# Patient Record
Sex: Female | Born: 1970 | Race: White | Hispanic: No | Marital: Married | Smoking: Never smoker
Health system: Southern US, Community
[De-identification: ages and names within clinical notes are randomized; demographics above are authoritative.]

## PROBLEM LIST (undated history)

## (undated) DIAGNOSIS — I10 Essential (primary) hypertension: Secondary | ICD-10-CM

## (undated) HISTORY — PX: ABDOMINAL HYSTERECTOMY: SHX81

## (undated) HISTORY — PX: TUBAL LIGATION: SHX77

## (undated) HISTORY — PX: CHOLECYSTECTOMY: SHX55

---

## 2000-05-05 ENCOUNTER — Other Ambulatory Visit: Admission: RE | Admit: 2000-05-05 | Discharge: 2000-05-05 | Payer: Self-pay | Admitting: Obstetrics and Gynecology

## 2003-01-24 ENCOUNTER — Encounter: Payer: Self-pay | Admitting: *Deleted

## 2003-01-24 ENCOUNTER — Inpatient Hospital Stay (HOSPITAL_COMMUNITY): Admission: AD | Admit: 2003-01-24 | Discharge: 2003-01-24 | Payer: Self-pay | Admitting: *Deleted

## 2003-01-28 ENCOUNTER — Inpatient Hospital Stay (HOSPITAL_COMMUNITY): Admission: AD | Admit: 2003-01-28 | Discharge: 2003-01-30 | Payer: Self-pay | Admitting: Obstetrics and Gynecology

## 2003-02-06 ENCOUNTER — Ambulatory Visit (HOSPITAL_COMMUNITY): Admission: RE | Admit: 2003-02-06 | Discharge: 2003-02-06 | Payer: Self-pay | Admitting: *Deleted

## 2015-04-01 ENCOUNTER — Emergency Department (HOSPITAL_COMMUNITY)
Admission: EM | Admit: 2015-04-01 | Discharge: 2015-04-01 | Disposition: A | Discharge status: Home / Self Care | Payer: BC Managed Care – PPO | Attending: Emergency Medicine | Admitting: Emergency Medicine

## 2015-04-01 ENCOUNTER — Encounter (HOSPITAL_COMMUNITY): Payer: Self-pay

## 2015-04-01 ENCOUNTER — Emergency Department (HOSPITAL_COMMUNITY): Payer: BC Managed Care – PPO

## 2015-04-01 DIAGNOSIS — I10 Essential (primary) hypertension: Secondary | ICD-10-CM | POA: Insufficient documentation

## 2015-04-01 DIAGNOSIS — J3489 Other specified disorders of nose and nasal sinuses: Secondary | ICD-10-CM | POA: Insufficient documentation

## 2015-04-01 DIAGNOSIS — R509 Fever, unspecified: Secondary | ICD-10-CM

## 2015-04-01 DIAGNOSIS — Z792 Long term (current) use of antibiotics: Secondary | ICD-10-CM | POA: Diagnosis not present

## 2015-04-01 DIAGNOSIS — R0981 Nasal congestion: Secondary | ICD-10-CM | POA: Diagnosis not present

## 2015-04-01 DIAGNOSIS — R300 Dysuria: Secondary | ICD-10-CM | POA: Diagnosis not present

## 2015-04-01 DIAGNOSIS — M546 Pain in thoracic spine: Secondary | ICD-10-CM | POA: Diagnosis not present

## 2015-04-01 DIAGNOSIS — R5383 Other fatigue: Secondary | ICD-10-CM | POA: Insufficient documentation

## 2015-04-01 DIAGNOSIS — Z79899 Other long term (current) drug therapy: Secondary | ICD-10-CM | POA: Diagnosis not present

## 2015-04-01 DIAGNOSIS — R0602 Shortness of breath: Secondary | ICD-10-CM | POA: Diagnosis not present

## 2015-04-01 DIAGNOSIS — R110 Nausea: Secondary | ICD-10-CM | POA: Diagnosis not present

## 2015-04-01 DIAGNOSIS — M25512 Pain in left shoulder: Secondary | ICD-10-CM | POA: Insufficient documentation

## 2015-04-01 DIAGNOSIS — M549 Dorsalgia, unspecified: Secondary | ICD-10-CM | POA: Diagnosis present

## 2015-04-01 HISTORY — DX: Essential (primary) hypertension: I10

## 2015-04-01 LAB — URINE MICROSCOPIC-ADD ON

## 2015-04-01 LAB — CBC
HEMATOCRIT: 37.9 % (ref 36.0–46.0)
HEMOGLOBIN: 12.8 g/dL (ref 12.0–15.0)
MCH: 31.4 pg (ref 26.0–34.0)
MCHC: 33.8 g/dL (ref 30.0–36.0)
MCV: 92.9 fL (ref 78.0–100.0)
Platelets: 167 10*3/uL (ref 150–400)
RBC: 4.08 MIL/uL (ref 3.87–5.11)
RDW: 13.5 % (ref 11.5–15.5)
WBC: 10.6 10*3/uL — ABNORMAL HIGH (ref 4.0–10.5)

## 2015-04-01 LAB — URINALYSIS, ROUTINE W REFLEX MICROSCOPIC
BILIRUBIN URINE: NEGATIVE
Glucose, UA: NEGATIVE mg/dL
Ketones, ur: NEGATIVE mg/dL
LEUKOCYTES UA: NEGATIVE
NITRITE: NEGATIVE
PH: 6 (ref 5.0–8.0)
Protein, ur: NEGATIVE mg/dL
SPECIFIC GRAVITY, URINE: 1.01 (ref 1.005–1.030)

## 2015-04-01 LAB — BASIC METABOLIC PANEL
ANION GAP: 8 (ref 5–15)
BUN: 11 mg/dL (ref 6–20)
CHLORIDE: 105 mmol/L (ref 101–111)
CO2: 23 mmol/L (ref 22–32)
Calcium: 8.5 mg/dL — ABNORMAL LOW (ref 8.9–10.3)
Creatinine, Ser: 0.92 mg/dL (ref 0.44–1.00)
GFR calc non Af Amer: >60 mL/min (ref >60)
GLUCOSE: 113 mg/dL — AB (ref 65–99)
POTASSIUM: 3.7 mmol/L (ref 3.5–5.1)
Sodium: 136 mmol/L (ref 135–145)

## 2015-04-01 LAB — I-STAT TROPONIN, ED: Troponin i, poc: 0 ng/mL (ref 0.00–0.08)

## 2015-04-01 LAB — LIPASE, BLOOD: Lipase: 21 U/L (ref 11–51)

## 2015-04-01 MED ORDER — IOHEXOL 350 MG/ML SOLN: 100.0000 mL | Freq: Once | INTRAVENOUS | Status: DC | PRN

## 2015-04-01 MED ORDER — OXYCODONE-ACETAMINOPHEN 5-325 MG PO TABS: 2.0000 | ORAL_TABLET | Freq: Four times a day (QID) | ORAL | Status: AC | PRN

## 2015-04-01 MED ORDER — ACETAMINOPHEN 500 MG PO TABS
1000.0000 mg | ORAL_TABLET | Freq: Once | ORAL | Status: AC
  Administered 2015-04-01: 1000 mg via ORAL
  Filled 2015-04-01: qty 2

## 2015-04-01 MED ORDER — MORPHINE SULFATE (PF) 4 MG/ML IV SOLN
4.0000 mg | Freq: Once | INTRAVENOUS | Status: AC
  Administered 2015-04-01: 4 mg via INTRAVENOUS
  Filled 2015-04-01: qty 1

## 2015-04-01 MED ORDER — SODIUM CHLORIDE 0.9 % IV BOLUS (SEPSIS)
1000.0000 mL | Freq: Once | INTRAVENOUS | Status: AC
  Administered 2015-04-01: 1000 mL via INTRAVENOUS

## 2015-04-01 NOTE — ED Notes (Signed)
Pt requesting pain medications. Dr.Mumma made aware

## 2015-04-01 NOTE — ED Notes (Signed)
Patient returned from XRay

## 2015-04-01 NOTE — Discharge Instructions (Signed)
Follow-up with your primary care physician in 1-2 days. Return to the emergency department for worsening shortness of breath, chest pain.

## 2015-04-01 NOTE — ED Notes (Signed)
Dr. Mumma at bedside.

## 2015-04-01 NOTE — ED Notes (Signed)
Per EMS, Patient has been having Hx of URI with antibiotic treatment. Patient reports feeling fatigued for the past couple days. Today, Patient started to have back pain that radiated down her right arm with increase upon cough, palpation, and deep breathing. Pt became SOB. Pt alert and oriented x4 upon arrival with Skin Warm, dry, and intact. Pt's pain 5/10. Patient was given 324 mg of Aspirin, 2 Nitro with no relief. Vitals 110/70, 100 HR, ST on EKG, 100% on RA, 2L for Comfort.

## 2015-04-01 NOTE — ED Notes (Signed)
CT contacted that pt's ready for angio

## 2015-04-01 NOTE — ED Provider Notes (Signed)
CSN: [id number]     Arrival date & time 04/01/15  1622 History   First MD Initiated Contact with Patient 04/01/15 1651     Chief Complaint  Patient presents with  . Back Pain     (Consider location/radiation/quality/duration/timing/severity/associated sxs/prior Treatment) HPI  Patient is a 44 year old female with past medical history significant for HTN, who presents to the emergency department with left back pain, shortness of breath, fever. Patient reports that she "hasn't felt well" over the past week. Reports a week history of congestion, rhinorrhea, dysuria, nausea. Patient seen at PCP 5 days ago, UA showed no signs of UTI at that time. Patient was started on clindamycin for sinusitis. States that her dysuria has resolved since that time. States that over the past 3 days she has had progressively worsening shortness of breath. Unable to walk across parking lot without feeling short of breath. Today he had the sudden onset of left scapula pain, sharp, constant, nonradiating. Nothing improves or worsens pain. Has not taken anything for the pain. Patient has felt feverish over the past 3 days, has not taken her temperature. No association with nausea, vomiting, diaphoresis. No history of PE/DVT. No recent surgery or prolonged immobility. No family history of blood clot.  Past Medical History  Diagnosis Date  . Hypertension    Past Surgical History  Procedure Laterality Date  . Tubal ligation    . Abdominal hysterectomy      Partial  . Cholecystectomy     History reviewed. No pertinent family history. Social History  Substance Use Topics  . Smoking status: Never Smoker   . Smokeless tobacco: Never Used  . Alcohol Use: Yes     Comment: Occasional   OB History    No data available     Review of Systems  Constitutional: Positive for fever, chills and fatigue. Negative for appetite change and unexpected weight change.  HENT: Positive for congestion and rhinorrhea. Negative for ear  pain and trouble swallowing.   Eyes: Negative for visual disturbance.  Respiratory: Positive for shortness of breath. Negative for cough, chest tightness and wheezing.   Cardiovascular: Negative for chest pain, palpitations and leg swelling.  Gastrointestinal: Negative for vomiting, abdominal pain and diarrhea.  Genitourinary: Positive for dysuria. Negative for urgency, frequency, hematuria, flank pain and vaginal discharge.  Musculoskeletal: Positive for back pain. Negative for neck pain and neck stiffness.  Skin: Negative for rash.  Neurological: Negative for dizziness, seizures, weakness and light-headedness.  Psychiatric/Behavioral: Negative for behavioral problems.      Allergies  Dilaudid and Wellbutrin  Home Medications   Prior to Admission medications   Medication Sig Start Date End Date Taking? Authorizing Provider  acetaminophen (TYLENOL) 325 MG tablet Take 650 mg by mouth every 6 (six) hours as needed for mild pain, moderate pain or headache.    Yes Historical Provider, MD  busPIRone (BUSPAR) 5 MG tablet Take 5 mg by mouth daily as needed (anxiety).  03/14/15  Yes Historical Provider, MD  cephALEXin (KEFLEX) 500 MG capsule Take 500 mg by mouth 3 (three) times daily. 03/24/15  Yes Historical Provider, MD  lisinopril-hydrochlorothiazide (PRINZIDE,ZESTORETIC) 20-12.5 MG tablet Take 1 tablet by mouth at bedtime. 07/20/13  Yes Historical Provider, MD  sertraline (ZOLOFT) 100 MG tablet Take 200 mg by mouth at bedtime. 03/02/15  Yes Historical Provider, MD  zolpidem (AMBIEN) 10 MG tablet Take 10 mg by mouth at bedtime. 03/29/15  Yes Historical Provider, MD  oxyCODONE-acetaminophen (PERCOCET/ROXICET) 5-325 MG tablet Take 2 tablets by  mouth every 6 (six) hours as needed for severe pain. 04/01/15   Corena HerterShannon Aydan Levitz, MD   BP 131/61 mmHg  Pulse 96  Temp(Src) 99.5 F (37.5 C) (Oral)  Resp 17  Ht 5\' 6"  (1.676 m)  Wt 131.543 kg  BMI 46.83 kg/m2  SpO2 97% Physical Exam  Constitutional:  She is oriented to person, place, and time. She appears well-developed and well-nourished. No distress.  HENT:  Head: Normocephalic and atraumatic.  Right Ear: External ear normal.  Left Ear: External ear normal.  Mouth/Throat: Oropharynx is clear and moist.  Eyes: Conjunctivae and EOM are normal. Pupils are equal, round, and reactive to light.  Neck: Normal range of motion. Neck supple. No JVD present. No tracheal deviation present.  Cardiovascular: Normal rate, regular rhythm, normal heart sounds and intact distal pulses.   No murmur heard. Pulmonary/Chest: Effort normal and breath sounds normal. No stridor. No respiratory distress. She has no wheezes. She has no rales. She exhibits no tenderness.  Abdominal: Soft. Bowel sounds are normal. She exhibits no distension. There is no tenderness. There is no rebound and no guarding.  Musculoskeletal: Normal range of motion. She exhibits no edema.  Neurological: She is alert and oriented to person, place, and time.  Skin: Skin is warm. No pallor.  Psychiatric: She has a normal mood and affect.  Nursing note and vitals reviewed.   ED Course  Procedures (including critical care time) Labs Review Labs Reviewed  BASIC METABOLIC PANEL - Abnormal; Notable for the following:    Glucose, Bld 113 (*)    Calcium 8.5 (*)    All other components within normal limits  CBC - Abnormal; Notable for the following:    WBC 10.6 (*)    All other components within normal limits  URINALYSIS, ROUTINE W REFLEX MICROSCOPIC (NOT AT [facility]) - Abnormal; Notable for the following:    Hgb urine dipstick TRACE (*)    All other components within normal limits  URINE MICROSCOPIC-ADD ON - Abnormal; Notable for the following:    Squamous Epithelial / LPF 0-5 (*)    Bacteria, UA FEW (*)    All other components within normal limits  LIPASE, BLOOD  I-STAT TROPOININ, ED    Imaging Review Dg Chest 2 View  04/01/2015  CLINICAL DATA:  Upper respiratory infection,  weakness, shortness of breath EXAM: CHEST  2 VIEW COMPARISON:  None. FINDINGS: The heart size and mediastinal contours are within normal limits. Both lungs are clear. The visualized skeletal structures are unremarkable. IMPRESSION: No active cardiopulmonary disease. Electronically Signed   By: Judie PetitM.  Shick M.D.   On: 04/01/2015 17:14   Ct Angio Chest Pe W/cm &/or Wo Cm  04/01/2015  CLINICAL DATA:  Upper respiratory infection.  Chest pain. EXAM: CT ANGIOGRAPHY CHEST WITH CONTRAST TECHNIQUE: Multidetector CT imaging of the chest was performed using the standard protocol during bolus administration of intravenous contrast. Multiplanar CT image reconstructions and MIPs were obtained to evaluate the vascular anatomy. CONTRAST:  100 cc Omnipaque 350 COMPARISON:  04/01/2015 FINDINGS: Body habitus reduces diagnostic sensitivity and specificity. Mediastinum/Nodes: No filling defect is identified in the pulmonary arterial tree to suggest pulmonary embolus. No acute aortic findings. No pathologic adenopathy in the chest. Lungs/Pleura: Unremarkable Upper abdomen: Unremarkable Musculoskeletal: Mild thoracic spondylosis. Review of the MIP images confirms the above findings. IMPRESSION: 1. No filling defect is identified in the pulmonary arterial tree to suggest pulmonary embolus. Sensitivity for subsegmental emboli reduced by body habitus. 2. No acute aortic findings or other  specific cause for the patient's current symptoms. Electronically Signed   By: Gaylyn Rong M.D.   On: 04/01/2015 21:10   I have personally reviewed and evaluated these images and lab results as part of my medical decision-making.   EKG Interpretation   Date/Time:  Tuesday April 01 2015 16:42:08 EST Ventricular Rate:  85 PR Interval:  163 QRS Duration: 89 QT Interval:  361 QTC Calculation: 429 R Axis:   65 Text Interpretation:  Sinus rhythm Low voltage, precordial leads Baseline  wander in lead(s) II III aVR aVL aVF V1 V2 V3 V4 V5 V6  Confirmed by BEATON   MD, ROBERT ([id number]) on 04/01/2015 7:12:57 PM      MDM   Final diagnoses:  Left-sided thoracic back pain  Shortness of breath  Fever, unspecified fever cause   Patient is a 44 year old female with a past medical history significant for hypertension, who presents to the emergency department with left back pain, shortness of breath, fever. One week history of not feeling well, started on clindamycin at PCP for sinusitis. 3 day history of progressively worsening shortness of breath, one day history of left scapula pain. On arrival no acute distress, appears ill. 100.5, hemodynamically stable, SPO2 98% on RA. Per report patient was tachycardic in the low 100s with EMS.  Differential diagnosis includes pneumonia, pneumothorax, pericarditis, pulmonary embolism, ACS, pleurisy, musculoskeletal. Heart score of 1, low risk. Low risk well's criteria.   EKG showed normal sinus rhythm. No interval abnormalities. No chamber enlargement. No signs of ischemia. No prior EKG to compare. CXR showed no acute findings. To be BC 10.6. No I abnormalities. UA showed no signs of infection. Initial troponin 0.00.  Patient given pain medication with minimal improvement of pain or shortness of breath. CTA PE study obtained, showed no signs of pulmonary embolism. Doubt pericarditis, no EKG changes, not consistent with clinical picture. Doubt ACS, low risk, no EKG findings, troponin negative. Patient most likely with pleurisy/musculoskeletal pain.   Patient discharged home with pain medication. Discussed strict follow-up with PCP in the next 1-2 days. Discussed strict return precautions to the emergency Department with any worsening shortness of breath or chest pain. Patient expressed understanding, no questions or concerns, discharge.   Corena Herter, MD 04/02/15 1610  Nelva Nay, MD 04/14/15 2245

## 2015-04-02 MED ORDER — IOHEXOL 350 MG/ML SOLN
100.0000 mL | Freq: Once | INTRAVENOUS | Status: AC | PRN
  Administered 2015-04-01: 100 mL via INTRAVENOUS

## 2015-09-02 DIAGNOSIS — I951 Orthostatic hypotension: Secondary | ICD-10-CM | POA: Insufficient documentation

## 2015-09-15 ENCOUNTER — Other Ambulatory Visit: Payer: Self-pay | Admitting: Family Medicine

## 2015-09-15 DIAGNOSIS — Z1231 Encounter for screening mammogram for malignant neoplasm of breast: Secondary | ICD-10-CM

## 2015-09-29 ENCOUNTER — Ambulatory Visit
Admission: RE | Admit: 2015-09-29 | Discharge: 2015-09-29 | Disposition: A | Discharge status: Home / Self Care | Payer: BC Managed Care – PPO | Source: Ambulatory Visit | Attending: Family Medicine | Admitting: Family Medicine

## 2015-09-29 DIAGNOSIS — Z1231 Encounter for screening mammogram for malignant neoplasm of breast: Secondary | ICD-10-CM

## 2016-11-17 IMAGING — CT CT ANGIO CHEST
2 of 6 series · 19 of 36 positions shown · IV contrast (Omni 300)
Comparison: 04/01/2015

CLINICAL DATA: Upper respiratory infection.  Chest pain.

EXAM:
CT ANGIOGRAPHY CHEST WITH CONTRAST
TECHNIQUE: Multidetector CT imaging of the chest was performed using the
standard protocol during bolus administration of intravenous
contrast. Multiplanar CT image reconstructions and MIPs were
obtained to evaluate the vascular anatomy.
CONTRAST:  100 cc Omnipaque 350

[Series 6: pe thins · axial · 0.73mm/px · z∈[-530,-274]mm · 18 of 570 slices shown]
[im 28/570  lung]
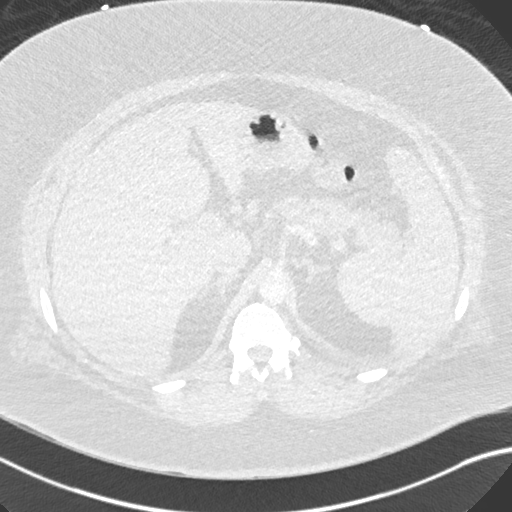
[im 55/570  mediastinal]
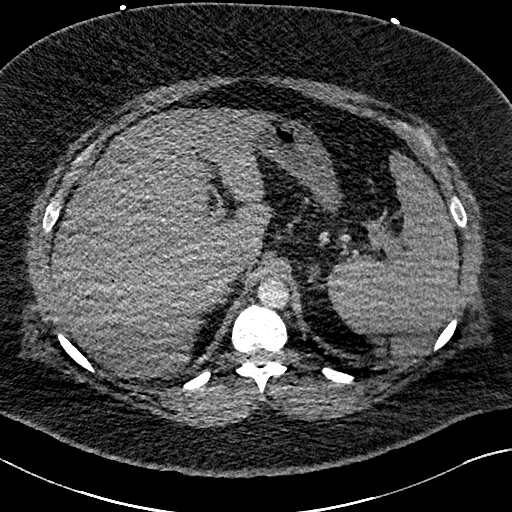
[im 82/570  lung]
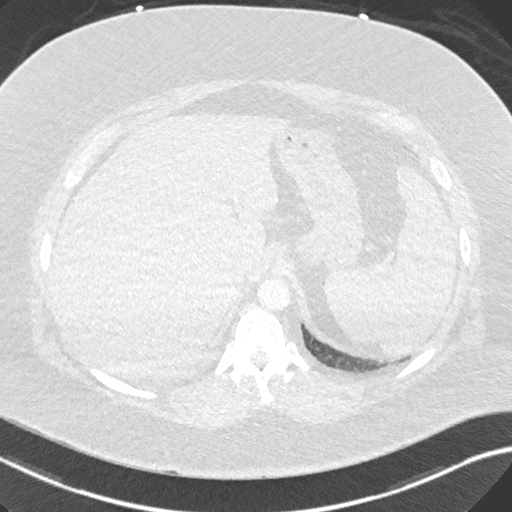
[im 109/570  mediastinal]
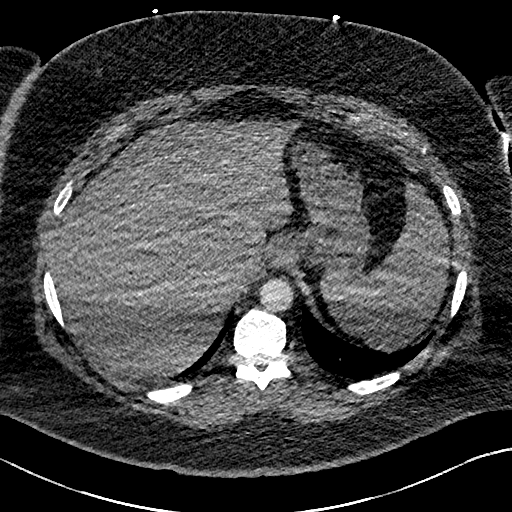
[im 163/570  lung]
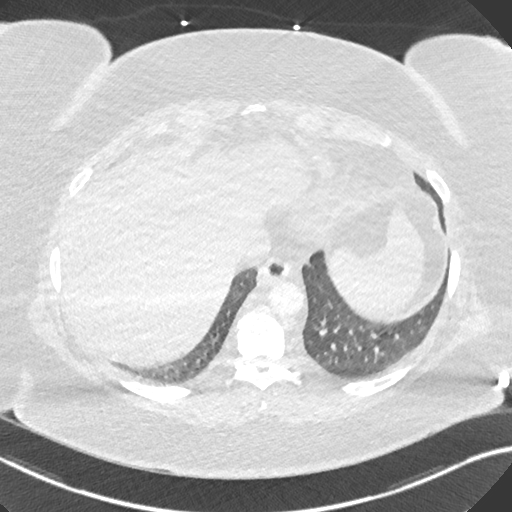
[im 190/570  mediastinal]
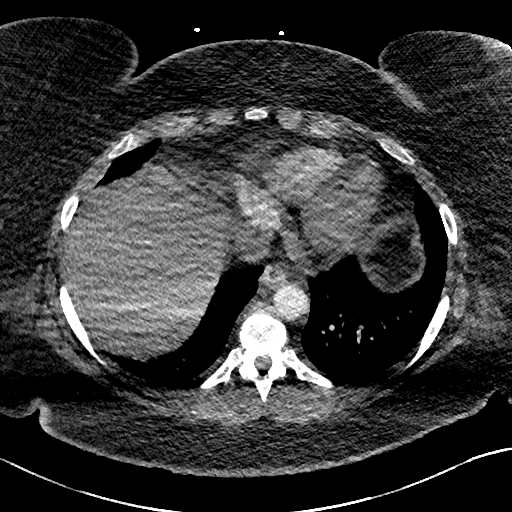
[im 217/570  lung]
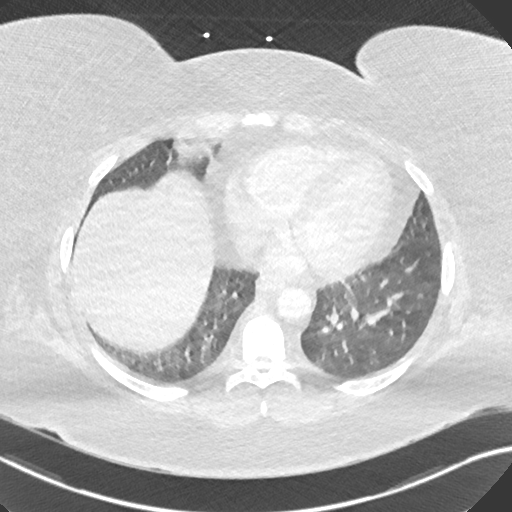
[im 244/570  mediastinal]
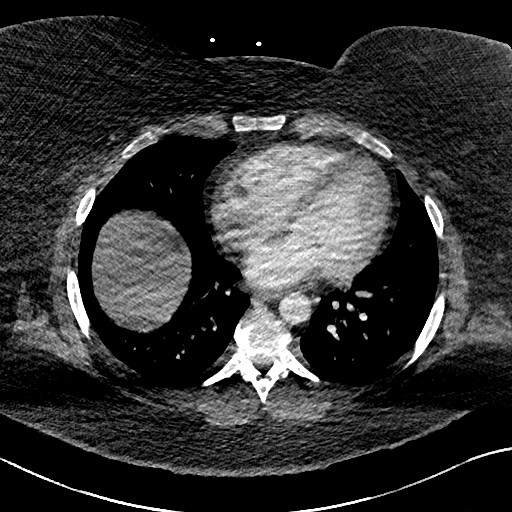
[im 271/570  lung]
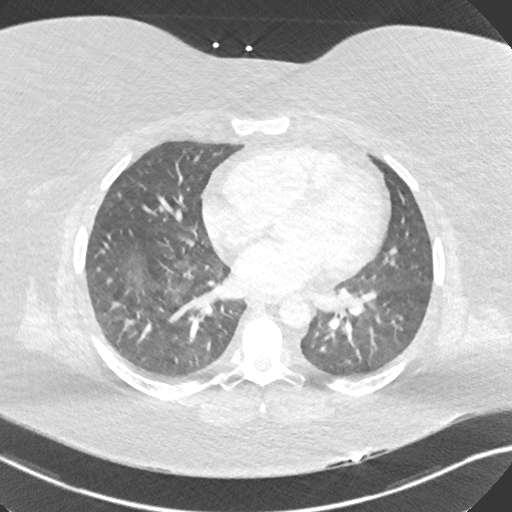
[im 299/570  mediastinal]
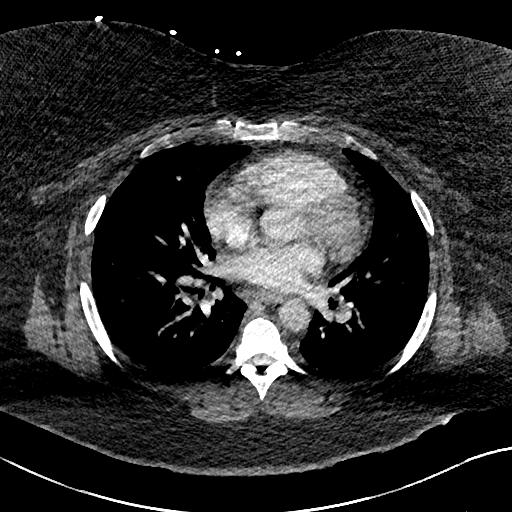
[im 326/570  lung]
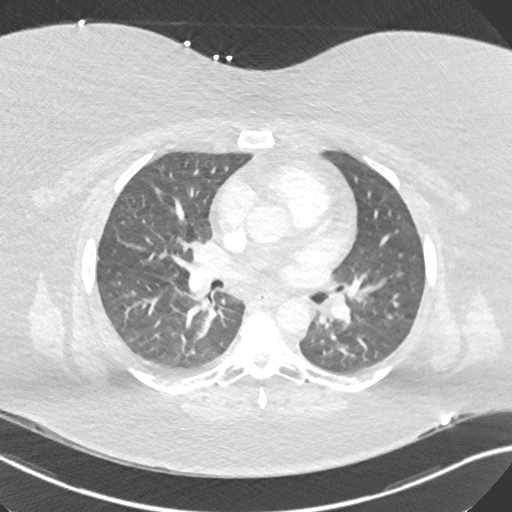
[im 353/570  mediastinal]
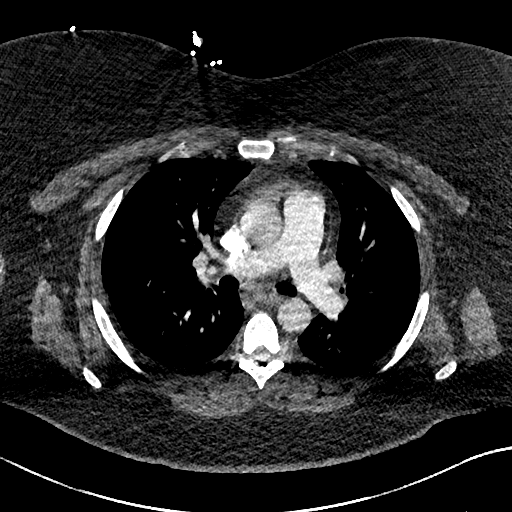
[im 380/570  lung]
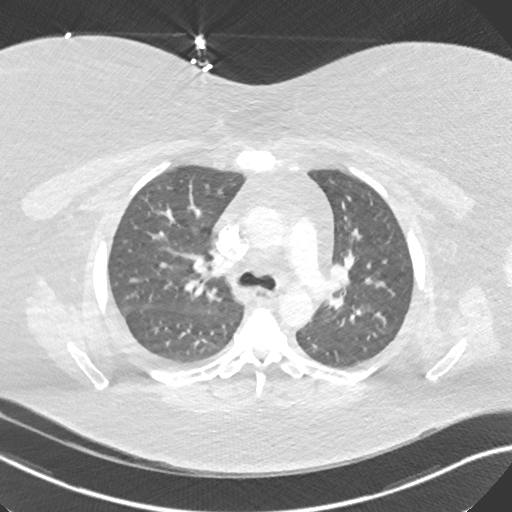
[im 434/570  mediastinal]
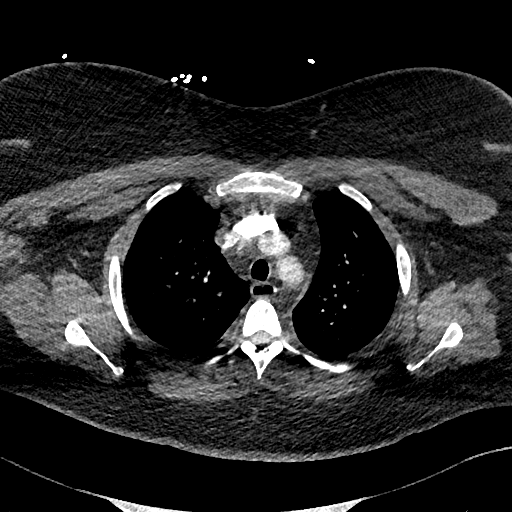
[im 461/570  lung]
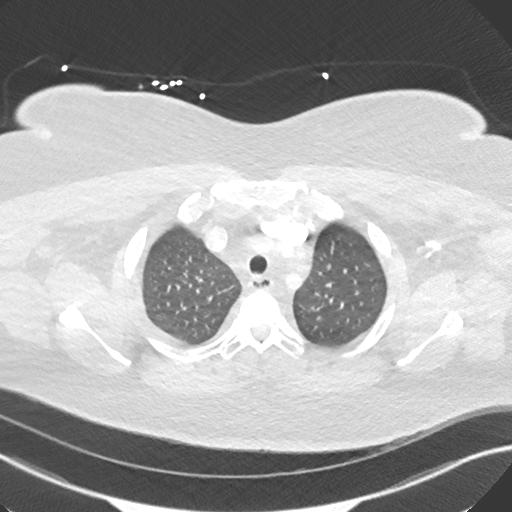
[im 488/570  mediastinal]
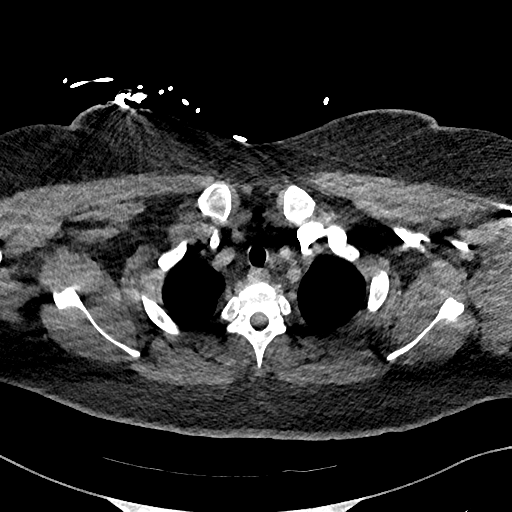
[im 515/570  lung]
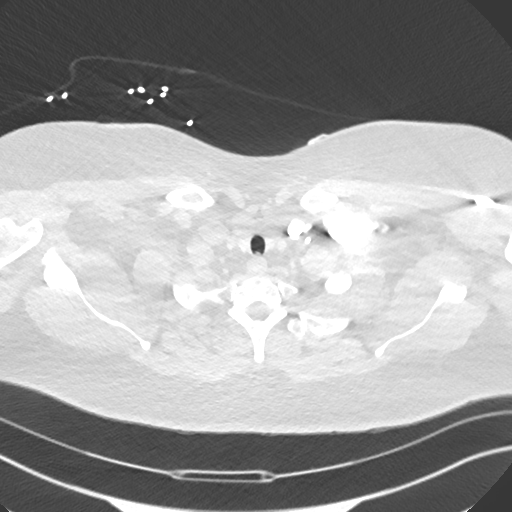
[im 542/570  mediastinal]
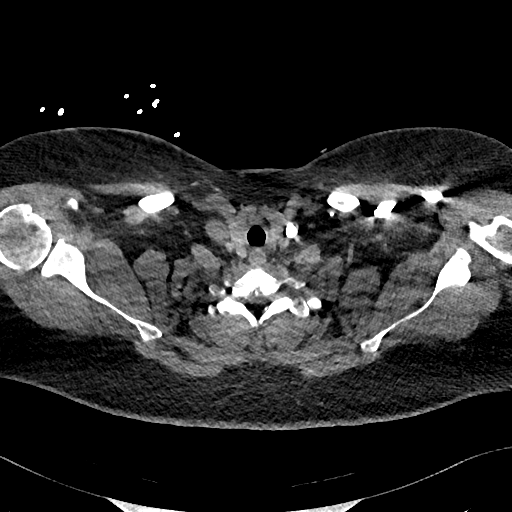

[Series 7: pe 2mm cor · coronal · 0.55mm/px · 1 of 136 slices shown]
[im 68/136  mediastinal]
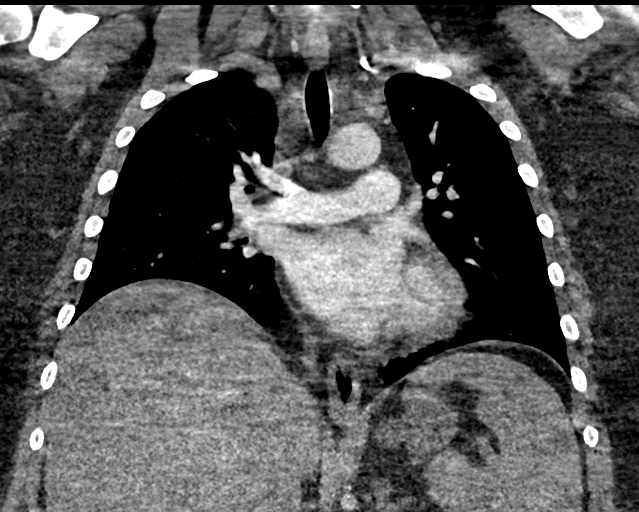

[19 of 36 positions shown; findings below may reference images not displayed]

FINDINGS: Body habitus reduces diagnostic sensitivity and specificity.

Mediastinum/Nodes: No filling defect is identified in the pulmonary
arterial tree to suggest pulmonary embolus. No acute aortic
findings. No pathologic adenopathy in the chest.

Lungs/Pleura: Unremarkable

Upper abdomen: Unremarkable

Musculoskeletal: Mild thoracic spondylosis.

Review of the MIP images confirms the above findings.
IMPRESSION: 1. No filling defect is identified in the pulmonary arterial tree to
suggest pulmonary embolus. Sensitivity for subsegmental emboli
reduced by body habitus.
2. No acute aortic findings or other specific cause for the
patient's current symptoms.

## 2022-04-21 DIAGNOSIS — D696 Thrombocytopenia, unspecified: Secondary | ICD-10-CM | POA: Insufficient documentation

## 2022-04-21 DIAGNOSIS — F32A Depression, unspecified: Secondary | ICD-10-CM | POA: Insufficient documentation

## 2022-04-21 DIAGNOSIS — Z87898 Personal history of other specified conditions: Secondary | ICD-10-CM | POA: Insufficient documentation

## 2023-07-27 DIAGNOSIS — F332 Major depressive disorder, recurrent severe without psychotic features: Secondary | ICD-10-CM | POA: Insufficient documentation

## 2023-08-31 ENCOUNTER — Telehealth (HOSPITAL_COMMUNITY): Payer: Self-pay

## 2023-08-31 NOTE — Telephone Encounter (Signed)
Returned call, no answer, left vm. AB

## 2023-09-01 ENCOUNTER — Encounter (HOSPITAL_COMMUNITY): Payer: Self-pay | Admitting: Interventional Radiology

## 2023-09-01 DIAGNOSIS — R55 Syncope and collapse: Secondary | ICD-10-CM

## 2023-09-01 DIAGNOSIS — Z8249 Family history of ischemic heart disease and other diseases of the circulatory system: Secondary | ICD-10-CM

## 2023-09-01 DIAGNOSIS — R519 Headache, unspecified: Secondary | ICD-10-CM

## 2023-10-19 NOTE — Progress Notes (Signed)
 Subjective:   I, Miquel Amen, CMA acting as a scribe for Kristen Juniper, MD.  Chief Complaint: Kristen Andrews, is a 53 y.o. female who presents for initial evaluation of a head injury. Reports walking to her table at [facility] when she slipped on something greasy on the floor causing her right knee to buckle and her left leg to slide out from under her, falling and hitting her head on the floor twice. Notes falling so hard that she chipped a tooth losing the filling and felt a pop in her right ear. Denies LOC. Pt was transported by EMS to [facility] ED and seen by [facility] neurology on 6/4.  Today, pt c/o cont'd dizziness when looking up, daily HA, dull in nature. HA exacerbated by bending at the waist, sneezing, coughing. C/O pressure in her face and pain at the temples. C/o ringing in both ears. Notices new drooping of the right eye and twitching in the left eye. Also c/o increased sweating since the accident. C/o throbbing pain in both are and n/t in B 4th and 5th digits. Denies prior dx of Migraine HA or ADHD. Recently retired 02/2023.   She notes the floor at the windows were and she fell was very slippery resulting in the fall.  Additionally she notes some increased knee pain and other aches and pains in her body.  Injury date: 10/05/23 Visit #: 1  History of Present Illness:   Concussion Self-Reported Symptom Score Symptoms rated on a scale 1-6, in last 24 hours  Headache: 6   Pressure in head: 6 Neck pain: 6 Nausea or vomiting: 0 Dizziness: 4  Blurred vision: 6  Balance problems: 4 Sensitivity to light:  6 Sensitivity to noise: 6 Feeling slowed down: 6 Feeling like "in a fog": 6 Don't feel right": 6 Difficulty concentrating: 6 Difficulty remembering: 6 Fatigue or low energy: 6 Confusion: 6 Drowsiness: 6 More emotional: 0 Irritability: 6 Sadness: 3 Nervous or anxious: 6 Trouble falling asleep: 0   Total # of Symptoms: 19/22 Total Symptom Score: 107/132  Tinnitus:  Yes  Review of Systems: Patient does note episodes of feeling hot and flushed.  She has had a hysterectomy and so does not menstruate.  She thinks she still has her ovaries.    Review of History: Knee arthritis.  Objective:    Physical Examination Vitals:   10/21/23 0939  BP: 104/78  Pulse: 77  SpO2: 96%   MSK: C-spine decreased cervical motion.  Nontender palpation midline. Paresthesias present bilateral upper extremities along C8 dermatomal lines.  Intact strength. Neuro: Alert and oriented.  Photophobia is present.  Positive VOMS testing. Psych: Normal speech thought process and affect.     Imaging:  CT HEAD WITHOUT CONTRAST, 10/05/2023 2:48 PM  INDICATION: Head trauma, moderate-severe  COMPARISON: None  TECHNIQUE: Axial CT images of the brain from skull base to vertex, including portions of the face and sinuses, were obtained without contrast. Supplemental 2D reformatted images were generated and reviewed as needed.  All CT scans at [facility] are performed using radiation dose optimization techniques as appropriate to a performed exam, including but not limited to one or more of the following: automatic exposure control, adjustment of the mA and/or kV according to patient size, use of iterative reconstruction technique. In addition, our institution participates in a radiation dose monitoring program to optimize patient radiation exposure.   FINDINGS:  Calvarium/skull base: No evidence of acute fracture or destructive lesion. Mastoids  and middle ears demonstrate no substantial mucosal disease.  Paranasal sinuses: No air fluid levels.  Brain: No acute large vascular territory infarct. No mass effect. No hydrocephalus. No acute hemorrhage.   IMPRESSION:  No acute intracranial abnormality.  CT CERVICAL SPINE WITHOUT CONTRAST, 10/05/2023 2:48 PM  INDICATION: Neck trauma, midline tenderness (Age  55-64y)  COMPARISON: Cervical spine radiographs 02/20/2023  TECHNIQUE: Thin-section axial CT images of the entire cervical spine were acquired without contrast. Supplemental 2D reformatted images were generated and reviewed as needed.  All CT scans at [facility] and [facility] are performed using radiation dose optimization techniques as appropriate to a performed exam, including but not limited to one or more of the following: automatic exposure control, adjustment of the mA and/or kV according to patient size, use of iterative reconstruction technique. In addition, our institution participates in a radiation dose monitoring program to optimize patient radiation exposure.  LEVELS IMAGED: Foramen magnum to upper thoracic region.   FINDINGS:  Alignment: No acute traumatic malalignment. Similar overall straightening of the cervical lordosis. No new malalignment or listhesis.  Craniocervical junction: No evidence of acute fracture or dislocation.  Vertebrae: No acute fractures. Vertebral body heights maintained.  Degenerative changes: Mild-to-moderate multilevel cervical spondylosis, with mild intervertebral disc height narrowing at C4-5, C5-6 and C6-7. 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 Bilateral uncovertebral spurring with varying degrees of neural foraminal narrowing at C4-5 and C5-6.  No prevertebral soft tissue edema. Visualized lung apices are clear. IMPRESSION:  1.  No evidence of acute fracture or traumatic malalignment of the cervical spine. 2.  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     Assessment and Plan   53 y.o. female with [place].  Symptoms in multiple domains.  Headache not well-controlled.  Plan for Trokendi.  Backup plan for immediate release Topamax.  Neck pain and vestibular: Plan for physical therapy.  Patient lives in [place] so use a [place]  location.  Mood: Pre-existing anxiety and depression treated with Zoloft and Vraylar.  Not ideally controlled currently.  Watchful waiting consider adding medication.  Knee pain exacerbation.  Patient with significant DJD.  Consider genicular artery embolization.  Recheck in 2 weeks     Action/Discussion: Reviewed diagnosis, management options, expected outcomes, and the reasons for scheduled and emergent follow-up. Questions were adequately answered. Patient expressed verbal understanding and agreement with the following plan.     Patient Education: Reviewed with patient the risks (i.e, a repeat concussion, post-concussion syndrome, second-impact syndrome) of returning to play prior to complete resolution, and thoroughly reviewed the signs and symptoms of concussion.Reviewed need for complete resolution of all symptoms, with rest AND exertion, prior to return to play. Reviewed red flags for urgent medical evaluation: worsening symptoms, nausea/vomiting, intractable headache, musculoskeletal changes, focal neurological deficits. [facility] Concussion Care Plan, which clearly outlines the plans stated above, was given to patient.   Level of service: Total encounter time 45 minutes including face-to-face time with the patient and, reviewing past medical record, and charting on the date of service.        After Visit Summary printed out and provided to patient as appropriate.  The above  documentation has been reviewed and is accurate and complete Kristen Andrews

## 2023-10-21 ENCOUNTER — Encounter: Payer: Self-pay | Admitting: Family Medicine

## 2023-10-21 ENCOUNTER — Ambulatory Visit: Admitting: Family Medicine

## 2023-10-21 ENCOUNTER — Telehealth: Payer: Self-pay

## 2023-10-21 VITALS — BP 104/78 | HR 77 | Ht 66.0 in | Wt 268.0 lb

## 2023-10-21 DIAGNOSIS — R2689 Other abnormalities of gait and mobility: Secondary | ICD-10-CM

## 2023-10-21 DIAGNOSIS — S060X0A Concussion without loss of consciousness, initial encounter: Secondary | ICD-10-CM | POA: Diagnosis not present

## 2023-10-21 DIAGNOSIS — M25561 Pain in right knee: Secondary | ICD-10-CM

## 2023-10-21 DIAGNOSIS — I10 Essential (primary) hypertension: Secondary | ICD-10-CM | POA: Insufficient documentation

## 2023-10-21 DIAGNOSIS — G8929 Other chronic pain: Secondary | ICD-10-CM

## 2023-10-21 DIAGNOSIS — G43719 Chronic migraine without aura, intractable, without status migrainosus: Secondary | ICD-10-CM

## 2023-10-21 DIAGNOSIS — M542 Cervicalgia: Secondary | ICD-10-CM

## 2023-10-21 DIAGNOSIS — Z8669 Personal history of other diseases of the nervous system and sense organs: Secondary | ICD-10-CM | POA: Insufficient documentation

## 2023-10-21 DIAGNOSIS — E785 Hyperlipidemia, unspecified: Secondary | ICD-10-CM | POA: Insufficient documentation

## 2023-10-21 DIAGNOSIS — M25562 Pain in left knee: Secondary | ICD-10-CM

## 2023-10-21 MED ORDER — TOPIRAMATE ER 50 MG PO CAP24: 50.0000 mg | ORAL_CAPSULE | Freq: Every day | ORAL | 2 refills | Status: AC

## 2023-10-21 NOTE — Telephone Encounter (Signed)
 Topiramate ER (TROKENDI XR) 50 MG CP24 [[id number]]   Order Details Dose: 50 mg Route: Oral Frequency: Daily  Dispense Quantity: 30 capsule (30 day supply) Refills: 2   Duration: -- Dispense As Written: No        Sig: Take 1 capsule (50 mg total) by mouth daily.       Start Date: 10/21/23 End Date: --  Written Date: 10/21/23

## 2023-10-21 NOTE — Telephone Encounter (Signed)
 Will need to complete on Monday, do not have migraine dx code for todays visit.

## 2023-10-21 NOTE — Patient Instructions (Addendum)
 Thank you for coming in today.   I've referred you to Physical Therapy.  Let us  know if you don't hear from them in one week.   I've sent a prescription for Trokendi to your pharmacy.  Check back in 2 weeks

## 2023-10-24 ENCOUNTER — Telehealth: Payer: Self-pay | Admitting: Family Medicine

## 2023-10-24 DIAGNOSIS — G8929 Other chronic pain: Secondary | ICD-10-CM

## 2023-10-24 NOTE — Telephone Encounter (Signed)
 Patient called and said Dr. Alease Hunter mentioned there was a procedure that could be done for her knee being bone on bone and he could get her the information for the radiologist. She is asking if she could maybe get set up for that. Please advise.

## 2023-10-24 NOTE — Telephone Encounter (Signed)
 Per visit note 10/21/23:  Assessment and Plan    53 y.o. female with [place].  Symptoms in multiple domains.   Headache not well-controlled.  Plan for Trokendi.  Backup plan for immediate release Topamax.   Neck pain and vestibular: Plan for physical therapy.  Patient lives in [place] so use a [place]  location.   Mood: Pre-existing anxiety and depression treated with Zoloft and Vraylar.  Not ideally controlled currently.  Watchful waiting consider adding medication.   Knee pain exacerbation.  Patient with significant DJD.  Consider genicular artery embolization.   Recheck in 2 weeks

## 2023-10-24 NOTE — Telephone Encounter (Signed)
 Dr. Alease Hunter, I do not see migraine dx code in visit encounter.

## 2023-10-24 NOTE — Addendum Note (Signed)
 Addended by: Charle Congo on: 10/24/2023 09:25 AM   Modules accepted: Orders

## 2023-10-25 NOTE — Telephone Encounter (Signed)
 Prior Authorization initiated for AIMOVIG via [email or web address] KEY: [id number]

## 2023-10-25 NOTE — Telephone Encounter (Signed)
 Trokendi XR is listed on [facility] preferred drug list, no PA required.

## 2023-10-25 NOTE — Telephone Encounter (Signed)
 I updated the diagnosis codes to include migraine

## 2023-11-02 ENCOUNTER — Encounter: Payer: Self-pay | Admitting: Family Medicine

## 2023-11-02 ENCOUNTER — Ambulatory Visit: Admitting: Family Medicine

## 2023-11-02 VITALS — BP 132/94 | HR 82 | Ht 66.0 in | Wt 267.0 lb

## 2023-11-02 DIAGNOSIS — G43719 Chronic migraine without aura, intractable, without status migrainosus: Secondary | ICD-10-CM | POA: Diagnosis not present

## 2023-11-02 DIAGNOSIS — R2689 Other abnormalities of gait and mobility: Secondary | ICD-10-CM

## 2023-11-02 DIAGNOSIS — M542 Cervicalgia: Secondary | ICD-10-CM

## 2023-11-02 DIAGNOSIS — S060X0D Concussion without loss of consciousness, subsequent encounter: Secondary | ICD-10-CM | POA: Diagnosis not present

## 2023-11-02 DIAGNOSIS — G245 Blepharospasm: Secondary | ICD-10-CM

## 2023-11-02 DIAGNOSIS — H60391 Other infective otitis externa, right ear: Secondary | ICD-10-CM

## 2023-11-02 DIAGNOSIS — S060X0A Concussion without loss of consciousness, initial encounter: Secondary | ICD-10-CM | POA: Insufficient documentation

## 2023-11-02 MED ORDER — NEOMYCIN-POLYMYXIN-HC 3.5-10000-1 OT SUSP: 4.0000 [drp] | Freq: Four times a day (QID) | OTIC | 1 refills | Status: AC

## 2023-11-02 NOTE — Progress Notes (Signed)
 Subjective:   I, Leotis Batter, CMA acting as a scribe for Artist Lloyd, MD.  Chief Complaint: Kristen Andrews,  is a 53 y.o. female who presents for 2-wk f/u concussion. Pt slipped on the greasy floor at [facility]. Pt was last seen by Dr. Lloyd on 10/21/23 and was prescribed Trokendi , Zoloft, and Vraylar. Pt was also referred to PT in [place]. Later, a GAE was ordered  Today, pt reports continued trouble with balance. Notes worsening emotional components, crying more often, easily upset. Concerns about the right eye, drooping more constantly and has started twitching. Also concerned about the right ear, fluid drained from the ear after a pop during the fall, continues to have difficulty with hearing. Also c/o constant n/t in B 4th and 5th digits as well as bilat great toes. Notes some improvement of HA and dizziness though sx have not resolved.   Injury date: 10/05/23 Visit #: 2  History of Present Illness:   Concussion Self-Reported Symptom Score Symptoms rated on a scale 1-6, in last 24 hours  Headache: 3   Pressure in head: 3 Neck pain: 1 Nausea or vomiting: 0 Dizziness: 1  Blurred vision: 2  Balance problems: 2 Sensitivity to light:  3 Sensitivity to noise: 5 Feeling slowed down: 5 Feeling like "in a fog": 5 Don't feel right": 5 Difficulty concentrating: 5 Difficulty remembering: 5 Fatigue or low energy: 5 Confusion: 5 Drowsiness: 5 More emotional: 6 Irritability: 6 Sadness: 6 Nervous or anxious: 6 Trouble falling asleep: 0   Total # of Symptoms: 20/22 Total Symptom Score: 84/132  Previous Total # of Symptoms: 19/22 Previous Symptom Score: 107/132   Review of Systems: No fevers or chills    Review of History: History of Bell's palsy.  Depression currently treated.  Objective:    Physical Examination Vitals:   11/02/23 1516  BP: (!) 132/94  Pulse: 82  SpO2: 98%   MSK: Normal cervical motion Neuro: Alert and oriented.  Normal coordination.  Intact  strength. Psych: Normal speech thought process and affect. HEENT: Normal eyelid motion.  No facial paralysis visible.  No conjunctival irritation. Right ear canal is erythematous.  Ear drum is translucent without bulging or drainage.    Imaging:  Patient had a C-spine MRI yesterday in [place] as part of [facility].  Images are not available at the time of this note.  Read is not available at the time of this note.  Assessment and Plan   53 y.o. female with concussion after slipping and falling at a restaurant on or around Oct 05, 2023.  Symptoms in multiple domains.  She does have neurologic and symptoms concerning for cervical spinal stenosis.  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  She did have an MRI yesterday but the pictures and radiology overread is not yet available.  Will likely order an epidural steroid injection or neurosurgical consultation.  Based on where she lives we may want to use Dr. Hillman.  Eyelid twitching.  Unclear etiology.  She does have a history of Bell's palsy and what it may be seen today is very minimal or early Bell's palsy.  Plan for some watchful waiting.  Ear drainage: Patient does have physical exam findings consistent with otitis externa.  Will try using polymyxin neomycin and hydrocortisone eardrops.  This should be helpful.  Migraine headache improving with Trokendi .  I do not think that Trokendi  is  causing the paresthesias she is experiencing in her upper and lower extremities.  Her paresthesia did not change significantly with the Trokendi .  Recheck in about a month.       Action/Discussion: Reviewed diagnosis, management options, expected outcomes, and the reasons for scheduled and emergent follow-up. Questions were adequately answered. Patient expressed verbal understanding and agreement with the following plan.     Patient  Education: Reviewed with patient the risks (i.e, a repeat concussion, post-concussion syndrome, second-impact syndrome) of returning to play prior to complete resolution, and thoroughly reviewed the signs and symptoms of concussion.Reviewed need for complete resolution of all symptoms, with rest AND exertion, prior to return to play. Reviewed red flags for urgent medical evaluation: worsening symptoms, nausea/vomiting, intractable headache, musculoskeletal changes, focal neurological deficits. [facility] Concussion Care Plan, which clearly outlines the plans stated above, was given to patient.   Level of service: Total encounter time 30 minutes including face-to-face time with the patient and, reviewing past medical record, and charting on the date of service.        After Visit Summary printed out and provided to patient as appropriate.  The above documentation has been reviewed and is accurate and complete Artist Lloyd

## 2023-11-02 NOTE — Patient Instructions (Addendum)
 Thank you for coming in today.   Check back around the end of July

## 2023-11-04 ENCOUNTER — Encounter (HOSPITAL_COMMUNITY): Payer: Self-pay | Admitting: Interventional Radiology

## 2023-11-15 ENCOUNTER — Other Ambulatory Visit: Payer: Self-pay | Admitting: Interventional Radiology

## 2023-11-15 DIAGNOSIS — G8929 Other chronic pain: Secondary | ICD-10-CM

## 2023-12-02 NOTE — Progress Notes (Incomplete)
 Chief Complaint: Patient was seen in consultation today for bilateral knee pain  Referring Physician(s): Corey,Evan S  History of Present Illness: Kristen Andrews is a 53 y.o. female with medical history significant for anxiety/depression, insomnia, OSA, Bell's Palsy (resolved) and HTN  a history of bilateral chronic knee pain.   [place] Pain Score = 83/96 VAS Pain Score =    Past Medical History:  Diagnosis Date   Hypertension     Past Surgical History:  Procedure Laterality Date   ABDOMINAL HYSTERECTOMY     Partial   CHOLECYSTECTOMY     TUBAL LIGATION      Allergies: Dilaudid [hydromorphone hcl] and Wellbutrin [bupropion]  Medications: Prior to Admission medications   Medication Sig Start Date End Date Taking? Authorizing Provider  acetaminophen  (TYLENOL ) 325 MG tablet Take 650 mg by mouth every 6 (six) hours as needed for mild pain, moderate pain or headache.     [provider]  busPIRone (BUSPAR) 5 MG tablet Take 5 mg by mouth daily as needed (anxiety).  03/14/15   [provider]  cephALEXin (KEFLEX) 500 MG capsule Take 500 mg by mouth 3 (three) times daily. 03/24/15   [provider]  lisinopril-hydrochlorothiazide (PRINZIDE,ZESTORETIC) 20-12.5 MG tablet Take 1 tablet by mouth at bedtime. 07/20/13   [provider]  neomycin -polymyxin-hydrocortisone (CORTISPORIN) 3.5-10000-1 OTIC suspension Place 4 drops into the right ear 4 (four) times daily. 11/02/23   Corey, Evan S, MD  oxyCODONE -acetaminophen  (PERCOCET/ROXICET) 5-325 MG tablet Take 2 tablets by mouth every 6 (six) hours as needed for severe pain. 04/01/15   Suzanne Kirsch, MD  sertraline (ZOLOFT) 100 MG tablet Take 200 mg by mouth at bedtime. 03/02/15   [provider]  Topiramate  ER (TROKENDI  XR) 50 MG CP24 Take 1 capsule (50 mg total) by mouth daily. 10/21/23   Corey, Evan S, MD  VRAYLAR 1.5 MG capsule Take 1.5 mg by mouth daily.    [provider]   zolpidem (AMBIEN) 10 MG tablet Take 10 mg by mouth at bedtime. 03/29/15   [provider]     No family history on file.  Social History   Socioeconomic History   Marital status: Married    Spouse name: Not on file   Number of children: Not on file   Years of education: Not on file   Highest education level: Not on file  Occupational History   Not on file  Tobacco Use   Smoking status: Never   Smokeless tobacco: Never  Substance and Sexual Activity   Alcohol use: Yes    Comment: Occasional   Drug use: No   Sexual activity: Yes    Birth control/protection: None  Other Topics Concern   Not on file  Social History Narrative   Not on file   Social Drivers of Health   Financial Resource Strain: Patient Declined (08/05/2023)   Received from [facility]   Overall Financial Resource Strain (CARDIA)    Difficulty of Paying Living Expenses: Patient declined  Food Insecurity: No Food Insecurity (12/01/2023)   Received from [facility]   Hunger Vital Sign    Within the past 12 months, you worried that your food would run out before you got the money to buy more.: Never true    Within the past 12 months, the food you bought just didn't last and you didn't have money to get more.: Never true  Transportation Needs: No Transportation Needs (12/02/2023)   Received from [facility]  PRAPARE - Transportation    In the past 12 months, has lack of transportation kept you from medical appointments or from getting medications?: No    In the past 12 months, has lack of transportation kept you from meetings, work, or from getting things needed for daily living?: No  Physical Activity: Unknown (08/05/2023)   Received from [facility]   Exercise Vital Sign    On average, how many days per week do you engage in moderate to strenuous exercise (like a brisk walk)?: 0 days    Minutes of Exercise per Session: Not on file  Stress: No Stress Concern Present (12/01/2023)   Received  from [facility] - Occupational Stress Questionnaire    Do you feel stress - tense, restless, nervous, or anxious, or unable to sleep at night because your mind is troubled all the time - these days?: Not at all  Social Connections: Socially Isolated (08/05/2023)   Received from [facility]   Social Network    How would you rate your social network (family, work, friends)?: Little participation, lonely and socially isolated    Review of Systems: A 12 point ROS discussed and pertinent positives are indicated in the HPI above.  All other systems are negative.  Review of Systems  Vital Signs: There were no vitals taken for this visit.    Physical Exam  Imaging: No results found.  Labs:  CBC: No results for input(s): WBC, HGB, HCT, PLT in the last 8760 hours.  COAGS: No results for input(s): INR, APTT in the last 8760 hours.  BMP: No results for input(s): NA, K, CL, CO2, GLUCOSE, BUN, CALCIUM, CREATININE, GFRNONAA, GFRAA in the last 8760 hours.  Invalid input(s): CMP  LIVER FUNCTION TESTS: No results for input(s): BILITOT, AST, ALT, ALKPHOS, PROT, ALBUMIN in the last 8760 hours.  TUMOR MARKERS: No results for input(s): AFPTM, CEA, CA199, CHROMGRNA in the last 8760 hours.  Assessment and Plan:  53 year old female with a history of bilateral knee pain.   Thank you for this interesting consult.  I greatly enjoyed meeting Kristen Andrews and look forward to participating in their care.  A copy of this report was sent to the requesting provider on this date.  Ester Sides, MD Pager: [phone]    I spent a total of  40 Minutes   in face to face in clinical consultation, greater than 50% of which was counseling/coordinating care for bilateral knee pain.

## 2023-12-03 NOTE — Progress Notes (Addendum)
 NEUROSURGERY PROGRESS NOTE 2 Days Post-Op  Kristen Andrews is a 53 y.o. female patient s/p Procedure(s): ANTERIOR CERVICAL DISCECTOMY WITH FUSION: C4/5, C5/6 on 12/01/2023.   SUBJECTIVE: Patient does tell me that she has some increased sensation at her right calf today.  She can also feel me touching her toes.  She continues to have decreased sensation into bilateral hands.  She has significant weaknesses of the right lower extremity today as well as the starting of contractures into the fingers of her right hand.  Husband and patient are quite concerned about this.  Patient is not having any difficulty swallowing and her incision is covered with Dermabond with no significant swelling seen at the incision site.  7/26/20025: Upon entering patient sitting up in chair.  She states she just got to the chair with OT and the CNA.  She states I feel like I have improved already.  She states she was actually able to hold a popsicle with her left hand.  she is eating and drinking without issues.  She has passed gas today, no bowel movement yet.  States she was able to get a pure wick approved as she has been incontinent since surgery (she had nocturnal urinary incontinence prior to surgery).  She denies any perineal numbness.  She worked with PT and OT yesterday who both recommend IPR-case management is working on this.  She states her pain is controlled and my neck is the best thing out of everything.  She has been been wearing her cervical collar but there is some redness where the soft collar touches her skin (not around the incision).  Family is at bedside.  She continues to wear her right contracture boot and right hand splint     OBJECTIVE:  GCS:  Eye opening: 4 - Opens eyes on own  Verbal response: 5 - Alert and oriented  Motor Response: 6 - Follows simple motor commands  Score:  15   General Appearance: NAD  Chest: Regular rate and effort of breathing. Neuro:  Patient is awake  and alert and oriented x 4.  CN II-XII grossly intact. Face symmetrical. Tongue midline. Pupils equal and reactive. Extra ocular movements intact.  Skin: wound benign with Dermabond in place clean dry and intact.  There is no redness, swelling, drainage noted to the incision.  There is an outline on her soft cervical neck of the collar causing some redness but she denies any issues or itchiness.  CERVICAL EXAM  Upper ExtremitySensory Exam: Today she reports equal sensation in both right and left hand, forearm, upper arm  Upper Extremity Motor Exam:                       Right        Left Shoulder abduction(C5)     5/5          5/5 Elbow flexion(C6)                     4/5          5-/5 Elbow extension (C7)          4/5          5-/5 Grip strength(C8)         2/5         3/5 Finger Abduction (T1)        1/5 contracture of right fingers          3/5  Gait, Coordination,  and Reflexes  Gait: Did not assess   LUMBAR EXAM  Lower Extremity Sensory Exam: Decreased sensation into right lower extremity.  Lower Extremity Motor Exam:                     Right         Left Hip flexion(L2-L3)       2/5        3/5 Knee extension(L3-L4)               1/5         3/5 Ankle dorsiflexion (L4)      0/5        3/5 Great toe extension(L5)      0/5         3/5 Ankle plantarflexion(S1)     2/5        3/5  Gait, Coordination, and Reflexes  Gait: Did not assess    BP (!) 114/55 (BP Location: Right Upper Arm, Patient Position: Lying)   Pulse 73   Temp 97.8 F (36.6 C) (Oral)   Resp 17   Ht 5' 6 (1.676 m)   Wt (!) 306 lb (138.8 kg)   LMP  (LMP Unknown)   SpO2 96%   BMI 49.39 kg/m   I/O this shift: In: 442 [P.O.:442] Out: -   Lab Results  Component Value Date   WBC 9.7 12/01/2023   HGB 13.5 12/01/2023   HCT 39.5 12/01/2023   MCV 92.5 12/01/2023   Plt Ct 184 12/01/2023   Lab Results  Component Value Date   Na 138 12/02/2023   Potassium 4.3 12/02/2023   Glucose 134 (H)  12/02/2023   BUN 21 12/02/2023   Creatinine 0.91 12/02/2023     IMP:  Compression of spinal cord with myelopathy (*) s/p Procedure(s): ANTERIOR CERVICAL DISCECTOMY WITH FUSION: C4/5, C5/6 on 12/01/2023.  PLAN:   Continue Decadron 4 mg every 6 hours. VSS and labwork reviewed  Neuro/Pain Imaging reviewed cervical x-rays show expected post surgical augmentation Continue monitoring for neurological changes. Continue pain control as ordered.  CV SBP goal less than 180  Respiratory  IS hourly Encouraged TCDB  GI/GU Continue diet ordered: Cardiac Bowel protocol  Infectious Afebrile. WBC as above  Hematologic As above  Skin/Wound Wound benign  Prophylaxis SCDs for DVT prophylaxis Chemical DVT prophylaxis: May start POD #3 heparin 5000units q8h subcutaneous   Mobility Activity OOB all meals and TID with assistance Keep shoulders in line with pelvis No bending, twisting, or lifting > 5 lbs.  Continue PT/OT  Appreciated PT/OT recommendations   Disposition Consult IPR Continue hospitalization Appreciate CM help with discharge planning. DME needs: TBD   Time spent with patient:  less than 30 minutes    Electronically Signed: Mariel JONELLE Hamlet, ACNP 12/03/2023 2:54 PM

## 2023-12-03 NOTE — Procedures (Addendum)
 [facility] Procedure Note Approved for a Pure Wick according to External Female Urine Collection Screening form.  Delivered to [facility] RN primary RN.  Procedures  Ronalee KANDICE Sieve, RN 12/03/2023 / 10:27 AM

## 2023-12-05 ENCOUNTER — Inpatient Hospital Stay: Admission: RE | Admit: 2023-12-05 | Source: Ambulatory Visit

## 2023-12-07 ENCOUNTER — Telehealth: Payer: Self-pay | Admitting: Family Medicine

## 2023-12-07 NOTE — Telephone Encounter (Signed)
 Pt called to cancel her follow up appt 7/31. She had back surgery and is still at [facility]. Some complications and having trouble using her hands, waiting to be sent to rehab.  Pt cannot use MyChart at this time due to her hands, unsure if Dr. Joane wanted to reach out for more info.

## 2023-12-08 ENCOUNTER — Encounter: Admitting: Family Medicine

## 2023-12-08 NOTE — Telephone Encounter (Signed)
Forwarding to Dr. Corey to review.  

## 2023-12-08 NOTE — Telephone Encounter (Signed)
 Recommend scheduling a follow-up with me when I return from my medical leave and when you get out of rehab

## 2023-12-12 NOTE — Telephone Encounter (Signed)
 Spoke to pt and provided Dr. Virgilio advise. She stated she's transitioned to [facility] facility and will be staying till mid-to-late-August. Pt stated that she would call the office to schedule a f/u visit after her discharge.

## 2023-12-26 NOTE — Discharge Summary (Signed)
 [facility] [employer] [facility]  [facility] Inpatient Discharge Summary  PCP: Sherrilyn Manila, NP Discharge Details   Admit date:         12/25/2023 Discharge date:        12/26/2023  Hospital Days:    1 days  Code Status:   Full Code Advanced Directives on file: No Directive        Discharge Diagnoses:  Principal Problem:   Weakness      Follow-Up Appointments Suggested: No follow-up provider specified. Follow-Up Appointments Already Scheduled: Future Appointments  Date Time Provider Department Center  12/28/2023  1:15 PM Christian Nifong [facility] REG None  01/17/2024  2:00 PM Boby GORMAN Mirza, NP WN NEU SLP None  01/31/2024  9:40 AM Lin JONELLE Civatte, MD [facility] NSR None    Discharge Medications:   Current Discharge Medication List     CONTINUE these medications which have CHANGED      Details  dexamethasone 2 MG tablet Commonly known as: DECADRON What changed:  medication strength See the new instructions.  Take two tablets (4 mg dose) by mouth every 12 (twelve) hours for 30 days. Quantity: 120 tablet   lisinopril-hydrochlorothiazide 10-12.5 MG per tablet Commonly known as: PRINZIDE,ZESTORETIC What changed: when to take this  TAKE 1 TABLET BY MOUTH EVERY DAY Quantity: 90 tablet       CONTINUE these medications which have NOT CHANGED      Details  albuterol sulfate HFA 108 (90 Base) MCG/ACT inhaler Commonly known as: PROVENTIL,VENTOLIN,PROAIR  Inhale one puff to two puffs into the lungs every 6 (six) hours as needed for Wheezing. Quantity: 18 g   azelastine 0.1 % nasal spray Commonly known as: ASTELIN  SPRAY 2 SPRAYS INTO EACH NOSTRIL EVERY DAY Quantity: 30 mL   ergocalciferol 50,000 units Caps capsule Commonly known as: Vitamin D2  Take one capsule (50,000 Units dose) by mouth once a week at 0900. MONDAY   GAVILAX 17 GM/SCOOP powder Generic drug: polyethylene glycol  Take 120 mLs (17 g dose) by mouth daily.   meloxicam 15 mg  tablet Commonly known as: MOBIC  Take one tablet (15 mg dose) by mouth daily.   * Misc. Devices Misc  Start AutoCPAP at 5-15 cm. water pressure.  Prefer Resmed S11 AutoCPAP machine with a mask, supplies and heated tubing for severe OSA with AHI 31.  Please do mask fitting and use a mask of patient preference.  Send to a local DME. Quantity: 1 each   * Misc. Devices Misc  Start CPAP at 15 cm. water pressure.  Prefer Resmed S11 CPAP machine with a mask, supplies and heated tubing for severe OSA with AHI 31.  Please use the mask of patient preference.  Send to a local DME. Quantity: 1 each   * Misc. Devices Misc  New mask of patients preference. Send to current DME Quantity: 1 each   omeprazole 40 mg capsule Commonly known as: PRILOSEC  Take one capsule (40 mg dose) by mouth daily as needed.   ondansetron 4 mg disintegrating tablet Commonly known as: ZOFRAN-ODT  Take one tablet (4 mg dose) by mouth every 12 (twelve) hours as needed for Nausea.   pregabalin 50 mg capsule Commonly known as: LYRICA  Take one capsule (50 mg dose) by mouth 3 (three) times a day. Quantity: 90 capsule   sertraline 100 mg tablet Commonly known as: ZOLOFT  Take one tablet (100 mg dose) by mouth 2 (two) times daily.   Spacer/Aero-Holding [employer]  Devi  one each by Does not apply route as needed. Quantity: 1 each   topiramate  50 MG ER capsule Commonly known as: TROKENDI   Take one capsule (50 mg dose) by mouth daily.   VRAYLAR 1.5 MG capsule Generic drug: cariprazine  Take one capsule (1.5 mg dose) by mouth at bedtime.   zolpidem tartrate 10 mg tablet Commonly known as: AMBIEN  Take one tablet (10 mg dose) by mouth at bedtime as needed for Sleep. Quantity: 30 tablet      * * This list has 3 medication(s) that are the same as other medications prescribed for you. Read the directions carefully, and ask your doctor or other care provider to review them with you.         * You might also be  taking other medications not listed above. If you have questions about any of your other medications, talk to the person who prescribed them or your Primary Care Provider.           Allergies: Allergies[1]  Consultations this Admission: IP CONSULT TO NEUROSURGERY IP CONSULT TO CASE MANAGEMENT, RN/SW  Procedures/Imaging:     CT Spine Cervical WO Contrast  Final Result  IMPRESSION: There are no findings to suggest an acute fracture or subluxation within the cervical spine.    Electronically Signed by: Rock Croft, MD on 12/25/2023 7:02 PM    MRI Spine Cervical WO Contrast  Final Result  IMPRESSION:     1.  Postsurgical changes of C4-C6 ACDF. Small ventral epidural fluid collections and residual bony ridging cause mild C3-C4 and moderate right-eccentric C5-C6 spinal canal stenosis. There is contact and mild mass effect on the right-eccentric cord at C5-C6, though the degree of mass effect and canal stenosis is improved from the preoperative MRI.  2.  Redemonstrated cord signal abnormality at C5-C6, favored myelomalacia.  3.  Cervical spondylosis with moderate left C4-C5, severe right C5-C6, and moderate left C5-C6 neural foraminal stenosis.    Electronically Signed by: Christopher Nguyen on 12/25/2023 4:28 PM    MRI Spine Lumbar WO Contrast  Final Result  IMPRESSION:    1.  Moderate-to-severe L1-L2 spinal canal stenosis from a disc protrusion with mass effect on the descending nerve roots.  2.  Moderate L3-L4 spinal canal stenosis from a disc protrusion, degenerative changes, and epidural lipomatosis.  3.  Severe L3-L4 and L4-L5 facet arthropathy.  4.  Focal 6 mm nodular thickening of a nerve root within the spinal canal at the L4 level, indeterminate but possibly a tiny nerve sheath tumor. Further evaluation could be considered with lumbar spine MRI with contrast.    Electronically Signed by: Christopher Nguyen on 12/25/2023 5:34 PM    MRI Spine Thoracic WO Contrast   Final Result  IMPRESSION:    1.  Mild to moderate thoracic spondylosis without substantial spinal canal stenosis.  2.  Moderate bilateral T7-T8, bilateral T8-T9, and right T9-10 neural foraminal stenosis from facet arthropathy.  3.  Tiny T6-T7 disc protrusion without neural impingement.    Electronically Signed by: Christopher Nguyen on 12/25/2023 5:18 PM    MRI Head WO Contrast  Final Result  IMPRESSION:    No acute intracranial abnormality.    Electronically Signed by: Christopher Nguyen on 12/25/2023 3:59 PM      Pertinent Labs:  Cardiac Labs: No results for input(s): CK, CKMB, CTNI, BNP in the last 168 hours. CBC: Recent Labs    Units 12/25/23 1100  WBC thou/mcL 8.9  HGB gm/dL 11.8  PLT thou/mcL 114*   BMP: Recent Labs    Units 12/25/23 1100  NA mmol/L 137  K mmol/L 3.5*  CL mmol/L 107  CO2 mmol/L 20  BUN mg/dL 28*  CREATININE mg/dL 9.19  MAGNESIUM mg/dL 2.2   Lipid Panel: No results for input(s): CHOL, TRIG, HDL, LDL in the last 168 hours. Liver Enzymes: Recent Labs    Units 12/26/23 0429 12/25/23 1100  INR  0.9  --   AST U/L  --  16  ALT U/L  --  32  ALKPHOS U/L  --  98  BILITOT mg/dL  --  0.2   Endocrine Panels: Recent Labs    Units 12/25/23 2250 12/25/23 1100  GLUCOSE mg/dL 811* [facility] Course   Physicians involved in care during this hospitalization Attending Provider: [place] DELENA Chapel, DO Attending Provider: Prentice Left, MD Attending Provider: Glendia JINNY Endo, MD Attending Provider: Lin JONELLE Civatte, MD Admitting Provider: Glendia JINNY Endo, MD Consulting Physician: Glendia JINNY Endo, MD   Admit date: 12/25/2023 Discharge date : 12/26/2023  Admission Diagnosis: Weakness Discharge diagnosis: Weakness  Admission condition: good Discharge condition: good  Complications: none  After discussion with Annamarie Civatte, MD regarding the risks, benefits, indications, and alternatives, the patient decided  to proceed with the above named procedure.  Hospital Course: Patient was admitted for paraesthesias and increase in weakness.  She was on the last day of a decadron taper and feels her symptoms started to return when starting on the lower dosing of steroid.  On day of discharge, patient reports she continues to have the increased weakness and paraesthesias.  We discussed plan and she would like to go home to start her outpatient PT/OT tomorrow.   Discharge exam   Blood pressure 118/63, pulse 82, temperature 98.8 F (37.1 C), temperature source Oral, resp. rate 18, height 1.676 m (5' 6), weight (!) 138.8 kg (306 lb), SpO2 97%, not currently breastfeeding.   General Appearance: well nourished, no distress  HEENT: Normocephalic, atraumatic. Neck supple, trachea midline, hearing intact Lung: no SOB, BBS clear Heart: RRR Abdomen: Abdomen soft, non-tender. BS normal.  Skin: warm and dry.    Neuro Exam:Patient is awake and alert and oriented x 3.  CN II-XII grossly intact. Face symmetrical. Tongue midline. Pupils equal and reactive. Extra ocular movements intact. Speech clear Sensory Exam: Sensation grossly intact to light touch Proprioception intact Motor Exam: Right UE grip 4/5 Left UE grip  4+/5 Right deltoid  5/5 Left deltoid  5/5 Right biceps  4+/5, pre-op 4/5 Left biceps  5/5 Right triceps 4+/5, pre-op 4+/5 Left triceps 5/5 Right wrist extensors 5/5 Left wrist extensors 5/5 Right finger extensors 4/5 unchanged Left finger extensors 4+/5   Right hip flexor  5/5 with limited ROM Left hip flexor  5/5 Right quads  5/5 Left quads  5/5 Right anterior tibialis  5/5 wearing an AFO Left anterior tibialis  5/5 Right peroneal  5/5 wearing an AFO Left peroneal  5/5 Gait: did not assess Cerebellar testing: Benign, normal finger to nose and heel to shin testing. No tremor    BP 118/63   Pulse 82   Temp 98.8 F (37.1 C) (Oral)   Resp 18   Ht 1.676 m (5' 6)   Wt (!) 138.8 kg  (306 lb)   LMP  (LMP Unknown)   SpO2 97%   BMI 49.39 kg/m     Post Hospital Care   Activity:   Weight Bearing Status:  Oxygen Orders for Discharge: O2 Device: None (Room air) SpO2: 97 %  Diet: Diet and Nourishment Orders (From admission, onward)     Start       12/26/23 0336  Consistent Carbohydrate  Diet effective now                   Wound Care Recommendations:    Lines/Drains/Airways: Patient Lines/Drains/Airways Status     Active LDAs     Name Placement date Placement time Site Days   Peripheral IV 20 G Anterior;Right Forearm 12/25/23  1057  Forearm  1            Therapy Recommendations:  PT:                OT:          SLP:              Home Health Orders: DME Orders (From admission, onward)    None      Home Health Agency     None       I spent 35 minutes performing discharge services.   Electronically signed: Lamarr LITTIE Ferrier, NP 12/26/2023 / 12:44 PM       [1] Allergies Allergen Reactions  . Hydromorphone Hcl Swelling and Anaphylaxis  . Hydromorphone Hcl Shortness Of Breath  . Sulfamethoxazole-Trimethoprim Nausea And Vomiting  . Bupropion Anxiety and Confusion    Suicidal thoughts  *Some images could not be shown.

## 2024-02-20 NOTE — Progress Notes (Shared)
 Chief Complaint: Patient was seen in consultation today for bilateral knee pain.   Referring Physician(s): Corey,Evan S  History of Present Illness: Kristen Andrews is a 53 y.o. female with a medical history significant for HTN, Bell's Palsy, anxiety/depression, morbid obesity, OSA requiring CPAP, cervical stenosis (s/p recent C4-6 ACDF 12/01/23) and acute pulmonary embolism with cor pulmonale. She has a history of bilateral knee pain, right > left.   She fell 10/05/23 while at a restaurant with subsequent head and knee injuries. She was found to have a concussion and she has had significant right knee pain with RLE numbness. She has tried OTC medications and physical therapy without satisfactory relief. She was evaluated by Dr. Joane in June and he recommended geniculate artery embolization. Unfortunately due to her hospitalization for her cervical stenosis and PE she has been unable to be seen in IR until now.   [place] Pain Score = 83/96 VAS Pain Score =    Past Medical History:  Diagnosis Date   Hypertension     Past Surgical History:  Procedure Laterality Date   ABDOMINAL HYSTERECTOMY     Partial   CHOLECYSTECTOMY     TUBAL LIGATION      Allergies: Dilaudid [hydromorphone hcl] and Wellbutrin [bupropion]  Medications: Prior to Admission medications   Medication Sig Start Date End Date Taking? Authorizing Provider  acetaminophen  (TYLENOL ) 325 MG tablet Take 650 mg by mouth every 6 (six) hours as needed for mild pain, moderate pain or headache.     [provider]  busPIRone (BUSPAR) 5 MG tablet Take 5 mg by mouth daily as needed (anxiety).  03/14/15   [provider]  cephALEXin (KEFLEX) 500 MG capsule Take 500 mg by mouth 3 (three) times daily. 03/24/15   [provider]  lisinopril-hydrochlorothiazide (PRINZIDE,ZESTORETIC) 20-12.5 MG tablet Take 1 tablet by mouth at bedtime. 07/20/13   [provider]   neomycin -polymyxin-hydrocortisone (CORTISPORIN) 3.5-10000-1 OTIC suspension Place 4 drops into the right ear 4 (four) times daily. 11/02/23   Corey, Evan S, MD  oxyCODONE -acetaminophen  (PERCOCET/ROXICET) 5-325 MG tablet Take 2 tablets by mouth every 6 (six) hours as needed for severe pain. 04/01/15   Suzanne Kirsch, MD  sertraline (ZOLOFT) 100 MG tablet Take 200 mg by mouth at bedtime. 03/02/15   [provider]  Topiramate  ER (TROKENDI  XR) 50 MG CP24 Take 1 capsule (50 mg total) by mouth daily. 10/21/23   Corey, Evan S, MD  VRAYLAR 1.5 MG capsule Take 1.5 mg by mouth daily.    [provider]  zolpidem (AMBIEN) 10 MG tablet Take 10 mg by mouth at bedtime. 03/29/15   [provider]     No family history on file.  Social History   Socioeconomic History   Marital status: Married    Spouse name: Not on file   Number of children: Not on file   Years of education: Not on file   Highest education level: Not on file  Occupational History   Not on file  Tobacco Use   Smoking status: Never   Smokeless tobacco: Never  Substance and Sexual Activity   Alcohol use: Yes    Comment: Occasional   Drug use: No   Sexual activity: Yes    Birth control/protection: None  Other Topics Concern   Not on file  Social History Narrative   Not on file   Social Drivers of Health   Financial Resource Strain: Patient Declined (08/05/2023)   Received from [facility]  Overall Financial Resource Strain (CARDIA)    Difficulty of Paying Living Expenses: Patient declined  Food Insecurity: No Food Insecurity (12/25/2023)   Received from [facility]   Hunger Vital Sign    Within the past 12 months, you worried that your food would run out before you got the money to buy more.: Never true    Within the past 12 months, the food you bought just didn't last and you didn't have money to get more.: Never true  Transportation Needs: No Transportation Needs (01/11/2024)   Received from  [facility] - Transportation    In the past 12 months, has lack of transportation kept you from medical appointments or from getting medications?: No    In the past 12 months, has lack of transportation kept you from meetings, work, or from getting things needed for daily living?: No  Physical Activity: Unknown (08/05/2023)   [facility]   Exercise Vital Sign    On average, how many days per week do you engage in moderate to strenuous exercise (like a brisk walk)?: 0 days    Minutes of Exercise per Session: Not on file  Stress: No Stress Concern Present (01/11/2024)   Received from [facility] - Occupational Stress Questionnaire    Do you feel stress - tense, restless, nervous, or anxious, or unable to sleep at night because your mind is troubled all the time - these days?: Not at all  Social Connections: Socially Isolated (08/05/2023)   Received from [facility]   Social Network    How would you rate your social network (family, work, friends)?: Little participation, lonely and socially isolated     Review of Systems: A 12 point ROS discussed and pertinent positives are indicated in the HPI above.  All other systems are negative.  Review of Systems  Vital Signs: There were no vitals taken for this visit.    Physical Exam  Imaging:  XR Knee 1-2 views Right 10/05/23  1.  No acute fracture.  2.  No malalignment.  3.  Small suprapatellar effusion.  4.  Tricompartmental osteoarthritis with complete joint space loss in the patellofemoral compartment, moderate joint space loss in the medial knee compartment, and mild joint space loss in the lateral compartment. Prominent marginal osteophytic change.   Labs:  CBC: No results for input(s): WBC, HGB, HCT, PLT in the last 8760 hours.  COAGS: No results for input(s): INR, APTT in the last 8760 hours.  BMP: No results for input(s): NA, K, CL,  CO2, GLUCOSE, BUN, CALCIUM, CREATININE, GFRNONAA, GFRAA in the last 8760 hours.  Invalid input(s): CMP  LIVER FUNCTION TESTS: No results for input(s): BILITOT, AST, ALT, ALKPHOS, PROT, ALBUMIN in the last 8760 hours.  TUMOR MARKERS: No results for input(s): AFPTM, CEA, CA199, CHROMGRNA in the last 8760 hours.  Assessment and Plan:  53 year old female with a history of bilateral knee pain, right >left.   Thank you for this interesting consult.  I greatly enjoyed meeting Kristen Andrews and look forward to participating in their care.  A copy of this report was sent to the requesting provider on this date.  Electronically Signed: Warren JONELLE Dais, NP 02/20/2024, 12:43 PM   I spent a total of  40 Minutes   in face to face in clinical consultation, greater than 50% of which was counseling/coordinating care for bilateral knee pain.

## 2024-02-22 ENCOUNTER — Other Ambulatory Visit: Payer: Self-pay | Admitting: Interventional Radiology

## 2024-02-22 ENCOUNTER — Inpatient Hospital Stay
Admission: RE | Admit: 2024-02-22 | Discharge: 2024-02-22 | Disposition: A | Source: Ambulatory Visit | Attending: Family Medicine | Admitting: Family Medicine

## 2024-02-22 ENCOUNTER — Ambulatory Visit
Admission: RE | Admit: 2024-02-22 | Discharge: 2024-02-22 | Disposition: A | Source: Ambulatory Visit | Attending: Interventional Radiology

## 2024-02-22 DIAGNOSIS — M1711 Unilateral primary osteoarthritis, right knee: Secondary | ICD-10-CM

## 2024-02-22 DIAGNOSIS — G8929 Other chronic pain: Secondary | ICD-10-CM

## 2024-02-22 HISTORY — PX: IR RADIOLOGIST EVAL & MGMT: IMG5224

## 2024-02-29 ENCOUNTER — Telehealth: Payer: Self-pay

## 2024-02-29 MED ORDER — METHYLPREDNISOLONE 4 MG PO TBPK: ORAL_TABLET | ORAL | 0 refills | Status: AC

## 2024-02-29 NOTE — Progress Notes (Signed)
 See telephone note  Pt called back, confirmed pharmacy, questions answered

## 2024-02-29 NOTE — Discharge Instructions (Signed)

## 2024-02-29 NOTE — Progress Notes (Signed)
Medrol dose pack escribed

## 2024-02-29 NOTE — H&P (Signed)
 Chief Complaint: Patient was seen in consultation today for right knee pain.   Referring Physician(s): Corey,Evan S   Supervising Physician: Jennefer Rover  Patient Status: [facility] - Outpatient   History of Present Illness: Kristen Andrews is a 53 y.o. female with a medical history significant for HTN, Bell's Palsy, anxiety/depression, morbid obesity, OSA requiring CPAP, cervical stenosis (s/p recent C4-6 ACDF 12/01/23) and acute pulmonary embolism with cor pulmonale. She has a history of bilateral knee pain, right > left.    She fell 10/05/23 while at a restaurant and sustained head and knee injuries. She was found to have a concussion and she has had significant right knee pain with RLE numbness. She has tried OTC medications and physical therapy without satisfactory relief. She was evaluated by Dr. Joane in June and he recommended geniculate artery embolization. Unfortunately due to her hospitalization for her cervical stenosis and PE she was unable to have a consultation with IR until 02/22/24. She met with Dr. Jennefer and they discussed the risks, benefits, alternatives and procedural expectations of right geniculate artery embolization. She was in agreement to proceed.   Past Medical History:  Diagnosis Date   Hypertension     Past Surgical History:  Procedure Laterality Date   ABDOMINAL HYSTERECTOMY     Partial   CHOLECYSTECTOMY     IR RADIOLOGIST EVAL & MGMT  02/22/2024   TUBAL LIGATION      Allergies: Dilaudid [hydromorphone hcl] and Wellbutrin [bupropion]  Medications: Prior to Admission medications   Medication Sig Start Date End Date Taking? Authorizing Provider  acetaminophen  (TYLENOL ) 325 MG tablet Take 650 mg by mouth every 6 (six) hours as needed for mild pain, moderate pain or headache.     [provider]  busPIRone (BUSPAR) 5 MG tablet Take 5 mg by mouth daily as needed (anxiety).  03/14/15   [provider]  cephALEXin (KEFLEX)  500 MG capsule Take 500 mg by mouth 3 (three) times daily. 03/24/15   [provider]  lisinopril-hydrochlorothiazide (PRINZIDE,ZESTORETIC) 20-12.5 MG tablet Take 1 tablet by mouth at bedtime. 07/20/13   [provider]  neomycin -polymyxin-hydrocortisone (CORTISPORIN) 3.5-10000-1 OTIC suspension Place 4 drops into the right ear 4 (four) times daily. 11/02/23   Corey, Evan S, MD  oxyCODONE -acetaminophen  (PERCOCET/ROXICET) 5-325 MG tablet Take 2 tablets by mouth every 6 (six) hours as needed for severe pain. 04/01/15   Suzanne Kirsch, MD  sertraline (ZOLOFT) 100 MG tablet Take 200 mg by mouth at bedtime. 03/02/15   [provider]  Topiramate  ER (TROKENDI  XR) 50 MG CP24 Take 1 capsule (50 mg total) by mouth daily. 10/21/23   Corey, Evan S, MD  VRAYLAR 1.5 MG capsule Take 1.5 mg by mouth daily.    [provider]  zolpidem (AMBIEN) 10 MG tablet Take 10 mg by mouth at bedtime. 03/29/15   [provider]     No family history on file.  Social History   Socioeconomic History   Marital status: Married    Spouse name: Not on file   Number of children: Not on file   Years of education: Not on file   Highest education level: Not on file  Occupational History   Not on file  Tobacco Use   Smoking status: Never   Smokeless tobacco: Never  Substance and Sexual Activity   Alcohol use: Yes    Comment: Occasional   Drug use: No   Sexual activity: Yes    Birth control/protection: None  Other Topics Concern   Not on file  Social History Narrative   Not on file   Social Drivers of Health   Financial Resource Strain: Patient Declined (08/05/2023)   Received from [facility]   Overall Financial Resource Strain (CARDIA)    Difficulty of Paying Living Expenses: Patient declined  Food Insecurity: No Food Insecurity (12/25/2023)   Received from [facility]   Hunger Vital Sign    Within the past 12 months, you worried that your food would run out before  you got the money to buy more.: Never true    Within the past 12 months, the food you bought just didn't last and you didn't have money to get more.: Never true  Transportation Needs: No Transportation Needs (01/11/2024)   Received from [facility] - Transportation    In the past 12 months, has lack of transportation kept you from medical appointments or from getting medications?: No    In the past 12 months, has lack of transportation kept you from meetings, work, or from getting things needed for daily living?: No  Physical Activity: Unknown (08/05/2023)   Received from [facility]   Exercise Vital Sign    On average, how many days per week do you engage in moderate to strenuous exercise (like a brisk walk)?: 0 days    Minutes of Exercise per Session: Not on file  Stress: No Stress Concern Present (01/11/2024)   Received from [facility] - Occupational Stress Questionnaire    Do you feel stress - tense, restless, nervous, or anxious, or unable to sleep at night because your mind is troubled all the time - these days?: Not at all  Social Connections: Socially Isolated (08/05/2023)   Received from [facility]   Social Network    How would you rate your social network (family, work, friends)?: Little participation, lonely and socially isolated    Review of Systems: A 12 point ROS discussed and pertinent positives are indicated in the HPI above.  All other systems are negative.  Review of Systems  Vital Signs: There were no vitals taken for this visit.  Physical Exam  Labs:  CBC: No results for input(s): WBC, HGB, HCT, PLT in the last 8760 hours.  COAGS: No results for input(s): INR, APTT in the last 8760 hours.  BMP: No results for input(s): NA, K, CL, CO2, GLUCOSE, BUN, CALCIUM, CREATININE, GFRNONAA, GFRAA in the last 8760 hours.  Invalid input(s): CMP  LIVER FUNCTION TESTS: No  results for input(s): BILITOT, AST, ALT, ALKPHOS, PROT, ALBUMIN in the last 8760 hours.  TUMOR MARKERS: No results for input(s): AFPTM, CEA, CA199, CHROMGRNA in the last 8760 hours.  Assessment and Plan:  Right knee pain: Suzen Dr. Zackary, 52 year old female, presents today for an image-guided right geniculate artery embolization.   Risks and benefits of this procedure  were discussed with the patient including, but not limited to bleeding, infection, vascular injury or contrast induced renal failure.  All of the patient's questions were answered, patient is agreeable to proceed.  Consent signed and in chart. She has been NPO.   Thank you for this interesting consult.  I greatly enjoyed meeting VETRA SHINALL and look forward to participating in their care.  A copy of this report was sent to the requesting provider on this date.  Electronically Signed: Warren Dais, AGACNP-BC 02/29/2024, 12:27 PM   I spent a total of  30 Minutes  in face to face in clinical consultation, greater than 50% of which was counseling/coordinating care for bilateral knee pain right >left.

## 2024-03-02 ENCOUNTER — Ambulatory Visit
Admission: RE | Admit: 2024-03-02 | Discharge: 2024-03-02 | Disposition: A | Discharge status: Home / Self Care | Source: Ambulatory Visit | Attending: Interventional Radiology | Admitting: Interventional Radiology

## 2024-03-02 DIAGNOSIS — M1711 Unilateral primary osteoarthritis, right knee: Secondary | ICD-10-CM

## 2024-03-02 HISTORY — PX: IR EMBO ARTERIAL NOT HEMORR HEMANG INC GUIDE ROADMAPPING: IMG5448

## 2024-03-02 MED ORDER — MIDAZOLAM HCL (PF) 2 MG/2ML IJ SOLN
INTRAMUSCULAR | Status: AC | PRN
  Administered 2024-03-02 (×2): 1 mg via INTRAVENOUS

## 2024-03-02 MED ORDER — DEXAMETHASONE SOD PHOSPHATE PF 10 MG/ML IJ SOLN
10.0000 mg | Freq: Once | INTRAMUSCULAR | Status: AC
  Administered 2024-03-02: 10 mg via INTRAVENOUS

## 2024-03-02 MED ORDER — SODIUM CHLORIDE 0.9 % IV SOLN: INTRAVENOUS | Status: DC

## 2024-03-02 MED ORDER — FENTANYL CITRATE (PF) 50 MCG/ML IJ SOSY: 25.0000 ug | PREFILLED_SYRINGE | INTRAMUSCULAR | Status: DC | PRN

## 2024-03-02 MED ORDER — KETOROLAC TROMETHAMINE 30 MG/ML IJ SOLN
30.0000 mg | Freq: Once | INTRAMUSCULAR | Status: AC
  Administered 2024-03-02: 30 mg via INTRAVENOUS

## 2024-03-02 MED ORDER — FENTANYL CITRATE (PF) 100 MCG/2ML IJ SOLN
INTRAMUSCULAR | Status: AC | PRN
  Administered 2024-03-02 (×2): 50 ug via INTRAVENOUS

## 2024-03-02 MED ORDER — IIOPAMIDOL (ISOVUE-250) INJECTION 51%
100.0000 mL | Freq: Once | INTRAVENOUS | Status: AC | PRN
  Administered 2024-03-02: 70 mL via INTRA_ARTERIAL

## 2024-03-02 MED ORDER — MIDAZOLAM HCL (PF) 2 MG/2ML IJ SOLN: 1.0000 mg | INTRAMUSCULAR | Status: DC | PRN

## 2024-03-02 MED ORDER — LIDOCAINE HCL (PF) 1 % IJ SOLN
10.0000 mL | Freq: Once | INTRAMUSCULAR | Status: AC
  Administered 2024-03-02: 10 mL via INTRADERMAL

## 2024-03-02 MED ORDER — NITROGLYCERIN 1 MG/10 ML FOR IR/CATH LAB
100.0000 ug | Freq: Once | INTRA_ARTERIAL | Status: AC
  Administered 2024-03-02: 100 ug via INTRA_ARTERIAL

## 2024-03-02 MED ORDER — ACETAMINOPHEN 10 MG/ML IV SOLN
1000.0000 mg | Freq: Once | INTRAVENOUS | Status: AC
  Administered 2024-03-02: 1000 mg via INTRAVENOUS

## 2024-03-02 NOTE — Procedures (Signed)
 Interventional Radiology Procedure Note  Procedure: Right geniculate artery embolization   Findings: Please refer to procedural dictation for full description. 4 Fr right posterior tibial artery access, manual compression.  Complications: None immediate  Estimated Blood Loss: < 5 Ml  Recommendations: IR will arrange 1 month outpatient follow up.   Ester Sides, MD

## 2024-03-05 ENCOUNTER — Telehealth: Payer: Self-pay

## 2024-03-05 NOTE — Progress Notes (Signed)
 See telephone note

## 2024-03-23 ENCOUNTER — Other Ambulatory Visit: Payer: Self-pay | Admitting: Interventional Radiology

## 2024-03-23 DIAGNOSIS — M1712 Unilateral primary osteoarthritis, left knee: Secondary | ICD-10-CM

## 2024-04-13 ENCOUNTER — Other Ambulatory Visit

## 2024-04-17 ENCOUNTER — Telehealth: Payer: Self-pay

## 2024-04-17 NOTE — Discharge Instructions (Signed)

## 2024-04-17 NOTE — Progress Notes (Signed)
 See telephone note

## 2024-04-18 ENCOUNTER — Telehealth: Payer: Self-pay

## 2024-04-18 MED ORDER — METHYLPREDNISOLONE 4 MG PO TBPK: ORAL_TABLET | ORAL | 0 refills | Status: AC

## 2024-04-18 NOTE — Progress Notes (Signed)
 See telephone note Medrol dose pack e-scribed

## 2024-04-20 ENCOUNTER — Inpatient Hospital Stay
Admission: RE | Admit: 2024-04-20 | Discharge: 2024-04-20 | Disposition: A | Source: Ambulatory Visit | Attending: Interventional Radiology

## 2024-04-20 DIAGNOSIS — M1712 Unilateral primary osteoarthritis, left knee: Secondary | ICD-10-CM

## 2024-04-20 HISTORY — PX: IR EMBO ARTERIAL NOT HEMORR HEMANG INC GUIDE ROADMAPPING: IMG5448

## 2024-04-20 MED ORDER — NITROGLYCERIN 1 MG/10 ML FOR IR/CATH LAB
100.0000 ug | INTRA_ARTERIAL | Status: DC | PRN
  Administered 2024-04-20 (×2): 100 ug via INTRA_ARTERIAL

## 2024-04-20 MED ORDER — FENTANYL CITRATE (PF) 100 MCG/2ML IJ SOLN
INTRAMUSCULAR | Status: DC | PRN
  Administered 2024-04-20: 50 ug via INTRAVENOUS

## 2024-04-20 MED ORDER — KETOROLAC TROMETHAMINE 30 MG/ML IJ SOLN
30.0000 mg | Freq: Once | INTRAMUSCULAR | Status: AC
  Administered 2024-04-20: 30 mg via INTRAVENOUS

## 2024-04-20 MED ORDER — IIOPAMIDOL (ISOVUE-250) INJECTION 51%
100.0000 mL | Freq: Once | INTRAVENOUS | Status: AC | PRN
  Administered 2024-04-20: 13:00:00 65 mL via INTRA_ARTERIAL

## 2024-04-20 MED ORDER — MIDAZOLAM HCL (PF) 2 MG/2ML IJ SOLN
INTRAMUSCULAR | Status: DC | PRN
  Administered 2024-04-20 (×2): 1 mg via INTRAVENOUS

## 2024-04-20 MED ORDER — DEXAMETHASONE SOD PHOSPHATE PF 10 MG/ML IJ SOLN
10.0000 mg | Freq: Once | INTRAMUSCULAR | Status: AC
  Administered 2024-04-20: 13:00:00 10 mg via INTRAVENOUS

## 2024-04-20 MED ORDER — LIDOCAINE-EPINEPHRINE 1 %-1:100000 IJ SOLN
10.0000 mL | Freq: Once | INTRAMUSCULAR | Status: AC
  Administered 2024-04-20: 10 mL via INTRADERMAL

## 2024-04-20 MED ORDER — FENTANYL CITRATE (PF) 50 MCG/ML IJ SOSY: 25.0000 ug | PREFILLED_SYRINGE | INTRAMUSCULAR | Status: DC | PRN

## 2024-04-20 MED ORDER — MIDAZOLAM HCL (PF) 2 MG/2ML IJ SOLN: 1.0000 mg | INTRAMUSCULAR | Status: DC | PRN

## 2024-04-20 MED ORDER — SODIUM CHLORIDE 0.9 % IV SOLN: INTRAVENOUS | Status: DC

## 2024-04-20 MED ORDER — ACETAMINOPHEN 10 MG/ML IV SOLN
1000.0000 mg | Freq: Once | INTRAVENOUS | Status: AC
  Administered 2024-04-20: 12:00:00 1000 mg via INTRAVENOUS

## 2024-04-20 NOTE — Procedures (Signed)
 Interventional Radiology Procedure Note  Procedure: Left geniculate artery embolization   Findings: Please refer to procedural dictation for full description. 4 Fr access via left posterior tibial artery, manual compression for hemostasis.  Complications: None immediate  Estimated Blood Loss: < 5 ml  Recommendations: IR will arrange 1 month outpatient follow up.   Ester Sides, MD

## 2024-04-20 NOTE — H&P (Signed)
 Chief Complaint: Patient was seen in consultation today for left knee pain.   Referring Physician(s): Dr. Joane  Supervising Physician: Jennefer Rover  Patient Status: [facility] - outpatient   History of Present Illness: Kristen Andrews is a 53 y.o. female with a medical history significant for HTN, Bell's Palsy, anxiety/depression, morbid obesity, OSA requiring CPAP, spinal cord injury, cervical stenosis (s/p recent C4-6 ACDF 12/01/23) and acute pulmonary embolism with cor pulmonale. She has a history of bilateral knee pain, right > left.    She fell 10/05/23 while at a restaurant and sustained head and knee injuries. She was found to have a concussion and had significant right knee pain with RLE numbness. She tried OTC medications and physical therapy without satisfactory relief. She was evaluated by Dr. Joane in June and he recommended geniculate artery embolization. Unfortunately due to her hospitalization for her cervical stenosis and PE she was unable to have a consultation with IR until 02/22/24. She met with Dr. Jennefer and they discussed the risks, benefits, alternatives and procedural expectations of right geniculate artery embolization. She was in agreement to proceed and this was performed 03/02/24. She tolerated the procedure well and experienced a significant reduction in right knee pain. She requested to have the left knee treated and she presents to the clinic today for a left geniculate artery embolization.   Past Medical History:  Diagnosis Date   Hypertension     Past Surgical History:  Procedure Laterality Date   ABDOMINAL HYSTERECTOMY     Partial   CHOLECYSTECTOMY     IR EMBO ARTERIAL NOT HEMORR HEMANG INC GUIDE ROADMAPPING  03/02/2024   IR RADIOLOGIST EVAL & MGMT  02/22/2024   TUBAL LIGATION      Allergies: Dilaudid [hydromorphone hcl] and Wellbutrin [bupropion]  Medications: Prior to Admission medications  Medication Sig Start Date End Date  Taking? Authorizing Provider  acetaminophen  (TYLENOL ) 325 MG tablet Take 650 mg by mouth every 6 (six) hours as needed for mild pain, moderate pain or headache.     [provider]  busPIRone (BUSPAR) 5 MG tablet Take 5 mg by mouth daily as needed (anxiety).  03/14/15   [provider]  cephALEXin (KEFLEX) 500 MG capsule Take 500 mg by mouth 3 (three) times daily. 03/24/15   [provider]  lisinopril-hydrochlorothiazide (PRINZIDE,ZESTORETIC) 20-12.5 MG tablet Take 1 tablet by mouth at bedtime. 07/20/13   [provider]  methylPREDNISolone  (MEDROL  DOSEPAK) 4 MG TBPK tablet Medrol  dose pack 03/02/24   Suttle, Rover PARAS, MD  methylPREDNISolone  (MEDROL  DOSEPAK) 4 MG TBPK tablet Take as prescribed by pharmacy 04/20/24   Jennefer Rover PARAS, MD  neomycin -polymyxin-hydrocortisone (CORTISPORIN) 3.5-10000-1 OTIC suspension Place 4 drops into the right ear 4 (four) times daily. 11/02/23   Corey, Evan S, MD  oxyCODONE -acetaminophen  (PERCOCET/ROXICET) 5-325 MG tablet Take 2 tablets by mouth every 6 (six) hours as needed for severe pain. 04/01/15   Suzanne Kirsch, MD  sertraline (ZOLOFT) 100 MG tablet Take 200 mg by mouth at bedtime. 03/02/15   [provider]  Topiramate  ER (TROKENDI  XR) 50 MG CP24 Take 1 capsule (50 mg total) by mouth daily. 10/21/23   Corey, Evan S, MD  VRAYLAR 1.5 MG capsule Take 1.5 mg by mouth daily.    [provider]  zolpidem (AMBIEN) 10 MG tablet Take 10 mg by mouth at bedtime. 03/29/15   [provider]     No family history on file.  Social History   Socioeconomic History  Marital status: Married    Spouse name: Not on file   Number of children: Not on file   Years of education: Not on file   Highest education level: Not on file  Occupational History   Not on file  Tobacco Use   Smoking status: Never   Smokeless tobacco: Never  Substance and Sexual Activity   Alcohol use: Yes    Comment: Occasional   Drug use:  No   Sexual activity: Yes    Birth control/protection: None  Other Topics Concern   Not on file  Social History Narrative   Not on file   Social Drivers of Health   Tobacco Use: Medium Risk (04/05/2024)   Received from [facility]   Patient History    Smoking Tobacco Use: Former    Smokeless Tobacco Use: Never    Passive Exposure: Not on file  Financial Resource Strain: Patient Declined (08/05/2023)   Received from [facility]   Overall Financial Resource Strain (CARDIA)    Difficulty of Paying Living Expenses: Patient declined  Food Insecurity: No Food Insecurity (12/25/2023)   Received from [facility]   Epic    Within the past 12 months, you worried that your food would run out before you got the money to buy more.: Never true    Within the past 12 months, the food you bought just didn't last and you didn't have money to get more.: Never true  Transportation Needs: No Transportation Needs (01/11/2024)   Received from [facility]    In the past 12 months, has lack of transportation kept you from medical appointments or from getting medications?: No    In the past 12 months, has lack of transportation kept you from meetings, work, or from getting things needed for daily living?: No  Physical Activity: Unknown (08/05/2023)   Received from [facility]   Exercise Vital Sign    On average, how many days per week do you engage in moderate to strenuous exercise (like a brisk walk)?: 0 days    Minutes of Exercise per Session: Not on file  Stress: No Stress Concern Present (01/11/2024)   Received from [facility] - Occupational Stress Questionnaire    Do you feel stress - tense, restless, nervous, or anxious, or unable to sleep at night because your mind is troubled all the time - these days?: Not at all  Social Connections: Socially Isolated (08/05/2023)   Received from [facility]   Social Network    How would you rate your  social network (family, work, friends)?: Little participation, lonely and socially isolated  Depression (PHQ2-9): Not on file  Alcohol Screen: Not on file  Housing: Andrews Risk (12/25/2023)   Received from [facility]    In the last 12 months, was there a time when you were not able to pay the mortgage or rent on time?: No    In the past 12 months, how many times have you moved where you were living?: 0    At any time in the past 12 months, were you homeless or living in a shelter (including now)?: No  Utilities: Not At Risk (12/25/2023)   Received from [facility]    In the past 12 months has the electric, gas, oil, or water company threatened to shut off services in your home?: No  Health Literacy: Not on file    Review of  Systems: A 12 point ROS discussed and pertinent positives are indicated in the HPI above.  All other systems are negative.  Review of Systems  Musculoskeletal:  Positive for arthralgias and myalgias.  All other systems reviewed and are negative.   Vital Signs: BP (!) 140/96 (BP Location: Right Arm, Patient Position: Sitting, Cuff Size: Normal)   Pulse 90   Temp 98 F (36.7 C) (Oral)   Resp 18   SpO2 98%   Physical Exam Constitutional:      General: She is not in acute distress.    Appearance: She is obese. She is not ill-appearing.  HENT:     Mouth/Throat:     Mouth: Mucous membranes are moist.     Pharynx: Oropharynx is clear.  Cardiovascular:     Rate and Rhythm: Normal rate.  Pulmonary:     Effort: Pulmonary effort is normal.  Abdominal:     Tenderness: There is no abdominal tenderness.  Musculoskeletal:        General: Tenderness present.     Comments: Left knee pain  Skin:    General: Skin is warm and dry.  Neurological:     Mental Status: She is alert and oriented to person, place, and time.     Imaging: No results found.  Labs:  CBC: No results for input(s): WBC, HGB, HCT, PLT in the last 8760  hours.  COAGS: No results for input(s): INR, APTT in the last 8760 hours.  BMP: No results for input(s): NA, K, CL, CO2, GLUCOSE, BUN, CALCIUM, CREATININE, GFRNONAA, GFRAA in the last 8760 hours.  Invalid input(s): CMP  LIVER FUNCTION TESTS: No results for input(s): BILITOT, AST, ALT, ALKPHOS, PROT, ALBUMIN in the last 8760 hours.  TUMOR MARKERS: No results for input(s): AFPTM, CEA, CA199, CHROMGRNA in the last 8760 hours.  Assessment and Plan:  Left knee pain: Kristen Andrews, 53 year old female, presents today for an image-guided left geniculate artery embolization.   Risks and benefits of this procedure were discussed with the patient including, but not limited to bleeding, infection, vascular injury or contrast induced renal failure.  All of the patient's questions were answered, patient is agreeable to proceed. She has been NPO.   Consent signed and in chart.  Thank you for this interesting consult.  I greatly enjoyed meeting Kristen Andrews and look forward to participating in their care.  A copy of this report was sent to the requesting provider on this date.  Electronically Signed: Warren Dais, AGACNP-BC 04/20/2024, 12:47 PM   I spent a total of  40 Minutes   in face to face in clinical consultation, greater than 50% of which was counseling/coordinating care for left knee pain.

## 2024-04-23 ENCOUNTER — Telehealth: Payer: Self-pay

## 2024-04-23 NOTE — Telephone Encounter (Signed)
 See telephone note

## 2024-06-05 ENCOUNTER — Other Ambulatory Visit: Payer: Self-pay | Admitting: Interventional Radiology

## 2024-06-05 DIAGNOSIS — M1712 Unilateral primary osteoarthritis, left knee: Secondary | ICD-10-CM
